# Patient Record
Sex: Female | Born: 1940 | Race: White | Hispanic: No | Marital: Married | Smoking: Never smoker
Health system: Southern US, Community
[De-identification: ages and names within clinical notes are randomized; demographics above are authoritative.]

## PROBLEM LIST (undated history)

## (undated) DIAGNOSIS — T783XXA Angioneurotic edema, initial encounter: Secondary | ICD-10-CM

## (undated) DIAGNOSIS — E7800 Pure hypercholesterolemia, unspecified: Secondary | ICD-10-CM

## (undated) DIAGNOSIS — L409 Psoriasis, unspecified: Secondary | ICD-10-CM

## (undated) DIAGNOSIS — K219 Gastro-esophageal reflux disease without esophagitis: Secondary | ICD-10-CM

## (undated) DIAGNOSIS — I10 Essential (primary) hypertension: Secondary | ICD-10-CM

## (undated) DIAGNOSIS — F0390 Unspecified dementia without behavioral disturbance: Secondary | ICD-10-CM

## (undated) DIAGNOSIS — G459 Transient cerebral ischemic attack, unspecified: Secondary | ICD-10-CM

## (undated) DIAGNOSIS — M1990 Unspecified osteoarthritis, unspecified site: Secondary | ICD-10-CM

## (undated) DIAGNOSIS — K5730 Diverticulosis of large intestine without perforation or abscess without bleeding: Secondary | ICD-10-CM

## (undated) DIAGNOSIS — R55 Syncope and collapse: Secondary | ICD-10-CM

## (undated) HISTORY — DX: Pure hypercholesterolemia, unspecified: E78.00

## (undated) HISTORY — DX: Transient cerebral ischemic attack, unspecified: G45.9

## (undated) HISTORY — DX: Essential (primary) hypertension: I10

## (undated) HISTORY — PX: TONSILLECTOMY: SUR1361

## (undated) HISTORY — DX: Gastro-esophageal reflux disease without esophagitis: K21.9

## (undated) HISTORY — DX: Syncope and collapse: R55

## (undated) HISTORY — DX: Diverticulosis of large intestine without perforation or abscess without bleeding: K57.30

## (undated) HISTORY — PX: EYE SURGERY: SHX253

---
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
## 2004-09-05 ENCOUNTER — Ambulatory Visit: Payer: Self-pay | Admitting: Internal Medicine

## 2005-08-14 ENCOUNTER — Ambulatory Visit: Payer: Self-pay | Admitting: Internal Medicine

## 2005-10-23 ENCOUNTER — Ambulatory Visit: Payer: Self-pay | Admitting: Urology

## 2006-02-19 ENCOUNTER — Ambulatory Visit: Payer: Self-pay | Admitting: Internal Medicine

## 2006-12-21 ENCOUNTER — Ambulatory Visit: Payer: Self-pay | Admitting: Internal Medicine

## 2007-12-22 ENCOUNTER — Ambulatory Visit: Payer: Self-pay | Admitting: Internal Medicine

## 2008-01-11 ENCOUNTER — Ambulatory Visit: Payer: Self-pay | Admitting: Internal Medicine

## 2008-07-11 ENCOUNTER — Ambulatory Visit: Payer: Self-pay | Admitting: Internal Medicine

## 2008-08-13 ENCOUNTER — Ambulatory Visit: Payer: Self-pay | Admitting: Internal Medicine

## 2008-12-25 ENCOUNTER — Ambulatory Visit: Payer: Self-pay | Admitting: Internal Medicine

## 2009-10-17 ENCOUNTER — Ambulatory Visit: Payer: Self-pay | Admitting: Ophthalmology

## 2009-10-29 ENCOUNTER — Ambulatory Visit: Payer: Self-pay | Admitting: Ophthalmology

## 2009-11-22 ENCOUNTER — Ambulatory Visit: Payer: Self-pay | Admitting: Ophthalmology

## 2009-12-03 ENCOUNTER — Ambulatory Visit: Payer: Self-pay | Admitting: Ophthalmology

## 2010-01-05 HISTORY — PX: OTHER SURGICAL HISTORY: SHX169

## 2010-01-17 ENCOUNTER — Ambulatory Visit: Payer: Self-pay | Admitting: Internal Medicine

## 2011-02-25 ENCOUNTER — Ambulatory Visit: Payer: Self-pay | Admitting: Internal Medicine

## 2011-03-09 ENCOUNTER — Ambulatory Visit: Payer: Self-pay | Admitting: Gastroenterology

## 2011-03-25 ENCOUNTER — Other Ambulatory Visit: Payer: Self-pay | Admitting: Gastroenterology

## 2011-03-25 LAB — CLOSTRIDIUM DIFFICILE BY PCR

## 2011-03-27 LAB — STOOL CULTURE

## 2011-11-05 ENCOUNTER — Ambulatory Visit (INDEPENDENT_AMBULATORY_CARE_PROVIDER_SITE_OTHER): Payer: Medicare Other | Admitting: Internal Medicine

## 2011-11-05 ENCOUNTER — Encounter: Payer: Self-pay | Admitting: Internal Medicine

## 2011-11-05 VITALS — BP 132/72 | HR 67 | Temp 98.5°F | Ht 65.0 in | Wt 155.0 lb

## 2011-11-05 DIAGNOSIS — E7800 Pure hypercholesterolemia, unspecified: Secondary | ICD-10-CM

## 2011-11-05 DIAGNOSIS — G459 Transient cerebral ischemic attack, unspecified: Secondary | ICD-10-CM

## 2011-11-05 DIAGNOSIS — R413 Other amnesia: Secondary | ICD-10-CM

## 2011-11-05 DIAGNOSIS — I10 Essential (primary) hypertension: Secondary | ICD-10-CM

## 2011-11-05 DIAGNOSIS — R109 Unspecified abdominal pain: Secondary | ICD-10-CM

## 2011-11-05 DIAGNOSIS — K219 Gastro-esophageal reflux disease without esophagitis: Secondary | ICD-10-CM

## 2011-11-05 NOTE — Patient Instructions (Addendum)
It was good seeing you today.  I am going to get an abdominal ultrasound and labs scheduled.  We will notify you of your results once they become available.

## 2011-11-06 ENCOUNTER — Encounter: Payer: Self-pay | Admitting: Internal Medicine

## 2011-11-06 DIAGNOSIS — I10 Essential (primary) hypertension: Secondary | ICD-10-CM | POA: Insufficient documentation

## 2011-11-06 DIAGNOSIS — G459 Transient cerebral ischemic attack, unspecified: Secondary | ICD-10-CM | POA: Insufficient documentation

## 2011-11-06 DIAGNOSIS — K219 Gastro-esophageal reflux disease without esophagitis: Secondary | ICD-10-CM | POA: Insufficient documentation

## 2011-11-06 NOTE — Assessment & Plan Note (Signed)
Blood pressure under good control.  Continue Lisinopril and HCTZ.  Follow.  Check metabolic panel with next labs.    

## 2011-11-06 NOTE — Assessment & Plan Note (Signed)
Continue on Lovastatin.  Check lipid panel and liver function with next fasting labs.      

## 2011-11-06 NOTE — Assessment & Plan Note (Signed)
Previous TIA.  On aspirin and doing well.  Follow.   

## 2011-11-06 NOTE — Progress Notes (Signed)
  Subjective:    Patient ID: Anita Santana, female    DOB: Aug 20, 1940, 71 y.o.   MRN: [id number]  HPI 71 year old female with past history of hypertension, hypercholesterolemia and GERD who comes in today for a scheduled follow up.  States she has been doing relatively well.  She has had some issues with her stomach.  Some change in appetite.  Does not eat as much as she used to.  She has noticed some queasiness at times.  Some aching in her stomach.  Food may make this worse.  She is still eating.  No vomiting.  Sees Dr Marva Panda.  Has reflux.  No chest pain or discomfort.  Had extensive cardiac w/up recently.  Protonix and Zantac seem to be controlling her symptoms.  No bowel change.  Taking Metamucil.    Past Medical History  Diagnosis Date  . GERD (gastroesophageal reflux disease)   . Hypertension   . Hypercholesteremia   . TIA (transient ischemic attack)     Review of Systems ROS Patient denies any headache, lightheadedness or dizziness.  No sinus or allergy symptoms.  No chest pain, tightness or palpatations.  No increased shortness of breath, cough or congestion.  GI symptoms as outlined.  No vomiting.  Some abdominal aching.  No bowel change, such as diarrhea, constipation, BRBPR or melana.  No urine change.        Objective:   Physical Exam Filed Vitals:   11/05/11 1529  BP: 132/72  Pulse: 67  Temp: 98.5 F (57.60 C)   71 year old female in no acute distress.   HEENT:  Nares - clear.  OP- without lesions or erythema.  NECK:  Supple, nontender.  No audible bruit.   HEART:  Appears to be regular. LUNGS:  Without crackles or wheezing audible.  Respirations even and unlabored.   RADIAL PULSE:  Equal bilaterally.  ABDOMEN:  Soft, nontender.  No audible abdominal bruit.   EXTREMITIES:  No increased edema to be present.                     Assessment & Plan:  GI.  Has the change in appetite, stomach aching and queasiness as outlined.  Has been persistent.  Will check cbc, liver  panel and amylase/lipase.  Also check abdominal ultrasound.  She has had EGD by Dr Marva Panda.  Reflux symptoms and the previous chest discomfort - controlled on Protonix and Zantac.    MEMORY CHANGE.  Husband reported (after the visit) concern regarding her short term memory.  With her scheduled labs, will also check B12/tsh.  Further w/up pending results.    HEALTH MAINTENANCE.  Will schedule a physical at the next visit.  Colonoscopy 2013.  Obtain records for review.  Flu shot given today.

## 2011-11-06 NOTE — Assessment & Plan Note (Signed)
Symptoms controlled on Protonix and Zantac.  Follow. Continue follow up with Dr Skulskie.    

## 2011-11-10 ENCOUNTER — Ambulatory Visit: Payer: Self-pay | Admitting: Internal Medicine

## 2011-11-12 ENCOUNTER — Telehealth: Payer: Self-pay | Admitting: Internal Medicine

## 2011-11-12 ENCOUNTER — Other Ambulatory Visit (INDEPENDENT_AMBULATORY_CARE_PROVIDER_SITE_OTHER): Payer: Medicare Other

## 2011-11-12 DIAGNOSIS — R413 Other amnesia: Secondary | ICD-10-CM

## 2011-11-12 DIAGNOSIS — R109 Unspecified abdominal pain: Secondary | ICD-10-CM

## 2011-11-12 DIAGNOSIS — E7800 Pure hypercholesterolemia, unspecified: Secondary | ICD-10-CM

## 2011-11-12 LAB — LIPID PANEL
Cholesterol: 277 mg/dL — ABNORMAL HIGH (ref 0–200)
HDL: 44.6 mg/dL (ref >39.00)
Triglycerides: 294 mg/dL — ABNORMAL HIGH (ref 0.0–149.0)
VLDL: 58.8 mg/dL — ABNORMAL HIGH (ref 0.0–40.0)

## 2011-11-12 LAB — CBC WITH DIFFERENTIAL/PLATELET
Basophils Absolute: 0.1 10*3/uL (ref 0.0–0.1)
HCT: 39 % (ref 36.0–46.0)
Lymphs Abs: 2.3 10*3/uL (ref 0.7–4.0)
MCV: 92.1 fl (ref 78.0–100.0)
Monocytes Absolute: 0.4 10*3/uL (ref 0.1–1.0)
Monocytes Relative: 6 % (ref 3.0–12.0)
Platelets: 262 10*3/uL (ref 150.0–400.0)
RDW: 13.5 % (ref 11.5–14.6)

## 2011-11-12 LAB — HEPATIC FUNCTION PANEL
Alkaline Phosphatase: 61 U/L (ref 39–117)
Bilirubin, Direct: 0 mg/dL (ref 0.0–0.3)
Total Bilirubin: 0.7 mg/dL (ref 0.3–1.2)

## 2011-11-12 LAB — BASIC METABOLIC PANEL
Calcium: 9.4 mg/dL (ref 8.4–10.5)
GFR: 60.89 mL/min (ref >60.00)
Sodium: 136 mEq/L (ref 135–145)

## 2011-11-12 LAB — LIPASE: Lipase: 26 U/L (ref 11.0–59.0)

## 2011-11-12 NOTE — Telephone Encounter (Addendum)
Called patient on cell and gave her the results.

## 2011-11-12 NOTE — Telephone Encounter (Signed)
Notify pt:  I received the abdominal ultrasound results.  Ultrasound reveals no acute abnormality.  Does appear to have some fatty liver.  Low cholesterol diet.

## 2011-11-20 ENCOUNTER — Encounter: Payer: Self-pay | Admitting: Internal Medicine

## 2011-11-20 DIAGNOSIS — K5730 Diverticulosis of large intestine without perforation or abscess without bleeding: Secondary | ICD-10-CM

## 2011-11-23 ENCOUNTER — Encounter: Payer: Self-pay | Admitting: *Deleted

## 2011-11-23 NOTE — Progress Notes (Signed)
Called patient to let her know. Patient could not talk to me at the moment and stated that she would call me back.

## 2011-11-23 NOTE — Progress Notes (Signed)
Result letter mailed to patient's home address.

## 2011-12-02 ENCOUNTER — Other Ambulatory Visit: Payer: Self-pay | Admitting: General Practice

## 2011-12-02 MED ORDER — LOVASTATIN 10 MG PO TABS: ORAL_TABLET | ORAL | Status: DC

## 2011-12-02 NOTE — Telephone Encounter (Signed)
Pt called stating that she needs her Lovastatin 5 mg refilled. States she takes meds 5 days a week per Dr. Lorin Picket. Med needs to be sent to [facility].

## 2011-12-02 NOTE — Telephone Encounter (Signed)
Sent in to pharmacy.  

## 2011-12-18 ENCOUNTER — Telehealth: Payer: Self-pay | Admitting: Internal Medicine

## 2011-12-18 MED ORDER — LISINOPRIL 40 MG PO TABS: 40.0000 mg | ORAL_TABLET | Freq: Every day | ORAL | Status: DC

## 2011-12-18 NOTE — Telephone Encounter (Addendum)
Refill - Pharmacy - [facility] Fax #[phone] Drug Name- Lisinopril Tab 40 mg Strength - Directions- take one by mouth daily  Quantity-90 day supply   Drug Name-Lovastatin 10 mg tab Directions-take 1 by mouth on Monday , Wednesday and Friday as directed Quantity-90 day supply

## 2011-12-18 NOTE — Telephone Encounter (Signed)
Sent in to pharmacy.  

## 2011-12-28 ENCOUNTER — Telehealth: Payer: Self-pay | Admitting: Internal Medicine

## 2011-12-28 NOTE — Telephone Encounter (Signed)
Pt is needing refill on Protonics, Hydrpcholorthorzide ??? HCTZ and  Lovastatin. [facility]

## 2011-12-29 MED ORDER — PANTOPRAZOLE SODIUM 40 MG PO TBEC: 40.0000 mg | DELAYED_RELEASE_TABLET | Freq: Every day | ORAL | Status: DC

## 2011-12-29 MED ORDER — LOVASTATIN 10 MG PO TABS: ORAL_TABLET | ORAL | Status: DC

## 2011-12-29 MED ORDER — HYDROCHLOROTHIAZIDE 25 MG PO TABS: 25.0000 mg | ORAL_TABLET | Freq: Every day | ORAL | Status: DC

## 2011-12-29 NOTE — Telephone Encounter (Signed)
Sent in to pharmacy.  

## 2012-01-07 ENCOUNTER — Telehealth: Payer: Self-pay | Admitting: Internal Medicine

## 2012-01-07 MED ORDER — HYDROCHLOROTHIAZIDE 25 MG PO TABS: 25.0000 mg | ORAL_TABLET | Freq: Every day | ORAL | Status: DC

## 2012-01-07 MED ORDER — LISINOPRIL 40 MG PO TABS: 40.0000 mg | ORAL_TABLET | Freq: Every day | ORAL | Status: DC

## 2012-01-07 MED ORDER — PANTOPRAZOLE SODIUM 40 MG PO TBEC: 40.0000 mg | DELAYED_RELEASE_TABLET | Freq: Every day | ORAL | Status: DC

## 2012-01-07 MED ORDER — LOVASTATIN 10 MG PO TABS: ORAL_TABLET | ORAL | Status: DC

## 2012-01-07 NOTE — Telephone Encounter (Signed)
Patient called in requesting her medication be faxed into [facility], it looks like you attempted to send 3 of the 4 in on 12/29/11. She needs her protonix, lovastatin, hydrodiual and lisinopril faxed into [phone]. It looks like these were sent in but the message on the med list is like the ones that a pharmacy wasn't selected on. If you have questions please ask.

## 2012-01-07 NOTE — Telephone Encounter (Signed)
Sent in to pharmacy.  

## 2012-01-17 ENCOUNTER — Telehealth: Payer: Self-pay | Admitting: Internal Medicine

## 2012-01-17 NOTE — Telephone Encounter (Signed)
Opened in error

## 2012-01-20 ENCOUNTER — Telehealth: Payer: Self-pay | Admitting: Internal Medicine

## 2012-01-20 MED ORDER — LOVASTATIN 10 MG PO TABS: ORAL_TABLET | ORAL | Status: DC

## 2012-01-20 MED ORDER — PANTOPRAZOLE SODIUM 40 MG PO TBEC: 40.0000 mg | DELAYED_RELEASE_TABLET | Freq: Every day | ORAL | Status: DC

## 2012-01-20 MED ORDER — LISINOPRIL 40 MG PO TABS: 40.0000 mg | ORAL_TABLET | Freq: Every day | ORAL | Status: DC

## 2012-01-20 MED ORDER — HYDROCHLOROTHIAZIDE 25 MG PO TABS: 25.0000 mg | ORAL_TABLET | Freq: Every day | ORAL | Status: DC

## 2012-01-20 NOTE — Telephone Encounter (Signed)
Corrected pharmacy error. Patient notified. Patient stated that she was going to wait for rx to arrive in mail.

## 2012-01-20 NOTE — Telephone Encounter (Signed)
Patient is completely out of her medication for her cholesterol and is needing all of her medications that were listed on 1.2.14 that were sent to [facility] . Her medication according to our records were sent to [facility] for [facility] instead of the correct pharmacy [facility] that is in [place].

## 2012-02-08 ENCOUNTER — Ambulatory Visit: Payer: Self-pay | Admitting: Gastroenterology

## 2012-02-08 DIAGNOSIS — K5730 Diverticulosis of large intestine without perforation or abscess without bleeding: Secondary | ICD-10-CM

## 2012-02-08 HISTORY — DX: Diverticulosis of large intestine without perforation or abscess without bleeding: K57.30

## 2012-02-09 ENCOUNTER — Encounter: Payer: Self-pay | Admitting: Adult Health

## 2012-02-09 ENCOUNTER — Ambulatory Visit (INDEPENDENT_AMBULATORY_CARE_PROVIDER_SITE_OTHER): Payer: Medicare Other | Admitting: Adult Health

## 2012-02-09 ENCOUNTER — Other Ambulatory Visit: Payer: Self-pay | Admitting: *Deleted

## 2012-02-09 VITALS — BP 138/82 | HR 84 | Temp 98.4°F | Resp 16 | Wt 154.0 lb

## 2012-02-09 DIAGNOSIS — I499 Cardiac arrhythmia, unspecified: Secondary | ICD-10-CM | POA: Insufficient documentation

## 2012-02-09 NOTE — Progress Notes (Signed)
  Subjective:    Patient ID: Anita Santana, female    DOB: August 21, 1940, 72 y.o.   MRN: [id number]  HPI  Anita Santana is a pleasant 72 y/o female with a hx of HTN, HLD, GERD, TIA who presents to clinic today s/p colonoscopy on 02/08/12 with report of arrhythmia/bigeminal block without change in her B/P. Patient does not recall event and further reports "I was sedated". When patient awoke from conscious sedation, she denied having any palpitations, chest pain, sob, dizziness, lightheadedness. She was completely asymptomatic. She was asked to f/u with her PCP by Dr. Marva Panda.  Today, patient present with her husband. She is feeling well. Denies any c/o. Once again, she is asymptomatic.   Current Outpatient Prescriptions on File Prior to Visit  Medication Sig Dispense Refill  . acetaminophen (TYLENOL ARTHRITIS PAIN) 650 MG CR tablet Take 650 mg by mouth as needed.      Marland Kitchen aspirin 81 MG tablet Take 81 mg by mouth daily.      . hydrochlorothiazide (HYDRODIURIL) 25 MG tablet Take 1 tablet (25 mg total) by mouth daily.  90 tablet  1  . lisinopril (PRINIVIL,ZESTRIL) 40 MG tablet Take 1 tablet (40 mg total) by mouth daily.  90 tablet  1  . lovastatin (MEVACOR) 10 MG tablet Take one tablet five times a week  60 tablet  1  . pantoprazole (PROTONIX) 40 MG tablet Take 1 tablet (40 mg total) by mouth daily.  90 tablet  1  . Psyllium (METAMUCIL PO) Take by mouth daily.      . ranitidine (ZANTAC) 150 MG tablet Take 150 mg by mouth as needed.      . simethicone (GAS-X) 80 MG chewable tablet Chew 80 mg by mouth as needed.          Review of Systems  Constitutional: Negative.   Respiratory: Negative for cough, chest tightness, shortness of breath and wheezing.   Cardiovascular: Negative for chest pain, palpitations and leg swelling.  Gastrointestinal: Negative.   Skin: Negative for color change, pallor and rash.  Neurological: Negative for dizziness, syncope, speech difficulty, light-headedness, numbness and  headaches.  Psychiatric/Behavioral: Negative.     BP 138/82  Pulse 84  Temp 98.4 F (36.9 C) (Oral)  Resp 16  Wt 154 lb (69.854 kg)  SpO2 96%     Objective:   Physical Exam  Constitutional: She is oriented to person, place, and time. She appears well-developed and well-nourished. No distress.  HENT:  Head: Normocephalic and atraumatic.  Eyes: Conjunctivae normal are normal. Pupils are equal, round, and reactive to light.  Neck: Normal range of motion. No tracheal deviation present.  Cardiovascular: Normal rate, regular rhythm, S1 normal, S2 normal, normal heart sounds, intact distal pulses and normal pulses.  PMI is not displaced.  Exam reveals no gallop, no S3, no S4 and no friction rub.   No murmur heard. Pulmonary/Chest: Effort normal and breath sounds normal.  Abdominal: Soft. Bowel sounds are normal.  Musculoskeletal: Normal range of motion. She exhibits no edema.  Lymphadenopathy:    She has no cervical adenopathy.  Neurological: She is alert and oriented to person, place, and time. She exhibits normal muscle tone. Coordination normal.  Skin: Skin is warm and dry.  Psychiatric: She has a normal mood and affect. Her behavior is normal. Judgment and thought content normal.        Assessment & Plan:

## 2012-02-09 NOTE — Assessment & Plan Note (Signed)
Patient is completely asymptomatic today. EKG done and compared to previous EKG done in 2011 without significant changes. Suspect possibility of arrythmia was a result of conscious sedation for procedure. Will refer to Cards for further evaluation.

## 2012-02-09 NOTE — Patient Instructions (Addendum)
  Your EKG today did not show significant changes in comparison to the one done in 2011.  I am referring you to [facility] for further evaluation given the report of your recent dysrhythmia during your colonoscopy.  Our office will call you with an appointment with either Dr. Mariah Milling or Dr. Kirke Corin.

## 2012-02-11 ENCOUNTER — Ambulatory Visit: Payer: Medicare Other | Admitting: Internal Medicine

## 2012-02-15 DIAGNOSIS — K5730 Diverticulosis of large intestine without perforation or abscess without bleeding: Secondary | ICD-10-CM | POA: Insufficient documentation

## 2012-02-18 ENCOUNTER — Ambulatory Visit: Payer: Medicare Other | Admitting: Cardiovascular Disease

## 2012-02-18 ENCOUNTER — Encounter: Payer: Self-pay | Admitting: Internal Medicine

## 2012-02-19 ENCOUNTER — Ambulatory Visit: Payer: Medicare Other | Admitting: Internal Medicine

## 2012-02-19 ENCOUNTER — Encounter: Payer: Medicare Other | Admitting: Internal Medicine

## 2012-02-26 ENCOUNTER — Ambulatory Visit (INDEPENDENT_AMBULATORY_CARE_PROVIDER_SITE_OTHER): Payer: Medicare Other | Admitting: Cardiovascular Disease

## 2012-02-26 ENCOUNTER — Encounter: Payer: Self-pay | Admitting: Cardiovascular Disease

## 2012-02-26 VITALS — BP 120/70 | HR 61 | Ht 65.5 in | Wt 154.5 lb

## 2012-02-26 DIAGNOSIS — I4891 Unspecified atrial fibrillation: Secondary | ICD-10-CM

## 2012-02-26 DIAGNOSIS — E7800 Pure hypercholesterolemia, unspecified: Secondary | ICD-10-CM

## 2012-02-26 DIAGNOSIS — I499 Cardiac arrhythmia, unspecified: Secondary | ICD-10-CM

## 2012-02-26 DIAGNOSIS — I10 Essential (primary) hypertension: Secondary | ICD-10-CM

## 2012-02-26 NOTE — Assessment & Plan Note (Signed)
We have suggested she try red yeast rice alternating with the lovastatin. Other options also include WelChol or colestipol, even Zetia. She does have a family history of stroke

## 2012-02-26 NOTE — Assessment & Plan Note (Signed)
Blood pressure is well controlled. She does report having periodic orthostatic type symptoms if she stands too quickly. I've asked her to monitor her blood pressure and if this is running low, she could perhaps cut her HCTZ in half. Husband reports she does not drink much fluids.

## 2012-02-26 NOTE — Progress Notes (Signed)
Patient ID: Anita Santana, female    DOB: [birth year], 72 y.o.   MRN: [id number]  HPI Comments: Anita Santana is a very pleasant 72 year old woman with history of GERD, hyperlipidemia who presents by referral from Dr. Lorin Picket and Racquel Ray for recent arrhythmia during a colonoscopy.  She reports that she had a magnesium citrate prep, drank several bottles with good effect prior to the procedure. Anesthesia was giving her sedation medicines when the telemetry showed normal sinus rhythm with 2-1 AV block for a short period of time. By report, arrhythmia shortly resolved and back to normal. She does not remember having any symptoms at the time.  Prior to the procedure and following the procedure, she denies any palpitations, lightheadedness or dizziness. She has had an EKG in the primary care office an EKG today which are essentially normal. She has not had any cardiac issues in the past.  She does report having occasional dizziness when she stands up too quickly  She does report having hyperlipidemia. She is taking lovastatin 10 mg 3 times per week. Taking a statin daily caused muscle ache.   She has had an echocardiogram several years ago at [facility]. Stress test several years ago at [facility]. She reports that these were both normal  EKG shows normal sinus rhythm with rate 61 beats per minute with no significant ST or T wave changes   Outpatient Encounter Prescriptions as of 02/26/2012  Medication Sig Dispense Refill  . acetaminophen (TYLENOL ARTHRITIS PAIN) 650 MG CR tablet Take 650 mg by mouth as needed.      Marland Kitchen aspirin 81 MG tablet Take 81 mg by mouth daily.      . hydrochlorothiazide (HYDRODIURIL) 25 MG tablet Take 1 tablet (25 mg total) by mouth daily.  90 tablet  1  . lisinopril (PRINIVIL,ZESTRIL) 40 MG tablet Take 1 tablet (40 mg total) by mouth daily.  90 tablet  1  . lovastatin (MEVACOR) 10 MG tablet Take one tablet five times a week  60 tablet  1  . pantoprazole (PROTONIX) 40 MG tablet Take 1  tablet (40 mg total) by mouth daily.  90 tablet  1  . Psyllium (METAMUCIL PO) Take by mouth daily.      . ranitidine (ZANTAC) 150 MG tablet Take 150 mg by mouth as needed.      . simethicone (GAS-X) 80 MG chewable tablet Chew 80 mg by mouth as needed.        Review of Systems  Constitutional: Negative.   HENT: Negative.   Eyes: Negative.   Respiratory: Negative.   Cardiovascular: Negative.   Gastrointestinal: Negative.   Musculoskeletal: Negative.   Skin: Negative.   Neurological: Negative.   Psychiatric/Behavioral: Negative.   All other systems reviewed and are negative.    BP 120/70  Pulse 61  Ht 5' 5.5" (1.664 m)  Wt 154 lb 8 oz (70.081 kg)  BMI 25.31 kg/m2  Physical Exam  Nursing note and vitals reviewed. Constitutional: She is oriented to person, place, and time. She appears well-developed and well-nourished.  HENT:  Head: Normocephalic.  Nose: Nose normal.  Mouth/Throat: Oropharynx is clear and moist.  Eyes: Conjunctivae are normal. Pupils are equal, round, and reactive to light.  Neck: Normal range of motion. Neck supple. No JVD present.  Cardiovascular: Normal rate, regular rhythm, S1 normal, S2 normal, normal heart sounds and intact distal pulses.  Exam reveals no gallop and no friction rub.   No murmur heard. Pulmonary/Chest: Effort normal and breath sounds normal.  No respiratory distress. She has no wheezes. She has no rales. She exhibits no tenderness.  Abdominal: Soft. Bowel sounds are normal. She exhibits no distension. There is no tenderness.  Musculoskeletal: Normal range of motion. She exhibits no edema and no tenderness.  Lymphadenopathy:    She has no cervical adenopathy.  Neurological: She is alert and oriented to person, place, and time. Coordination normal.  Skin: Skin is warm and dry. No rash noted. No erythema.  Psychiatric: She has a normal mood and affect. Her behavior is normal. Judgment and thought content normal.    Assessment and Plan

## 2012-02-26 NOTE — Patient Instructions (Addendum)
You are doing well. No medication changes were made.  For cholesterol, you could try red yeast rice on every other day with lovastatin Other options include welchol/colestipol Another one is zetia one a day  Please call us if you have new issues that need to be addressed before your next appt.

## 2012-02-26 NOTE — Assessment & Plan Note (Signed)
Normal sinus rhythm with 2:1 AV block noted for a short period while anesthesia medicines were given.  No symptoms prior to this,no symptoms since that time. 2 EKGs are essentially normal.  Arrhythmia likely secondary to anesthesia. We will hold off on any testing at this time as she has had echocardiogram and stress test in the recent past. We have suggested she has any additional lightheadedness or palpitation symptoms that she call our office for a Holter monitor.

## 2012-02-29 ENCOUNTER — Encounter: Payer: Self-pay | Admitting: Internal Medicine

## 2012-03-14 ENCOUNTER — Ambulatory Visit (INDEPENDENT_AMBULATORY_CARE_PROVIDER_SITE_OTHER): Payer: Medicare Other | Admitting: Internal Medicine

## 2012-03-14 ENCOUNTER — Encounter: Payer: Self-pay | Admitting: Internal Medicine

## 2012-03-14 ENCOUNTER — Other Ambulatory Visit (HOSPITAL_COMMUNITY)
Admission: RE | Admit: 2012-03-14 | Discharge: 2012-03-14 | Disposition: A | Discharge status: Home / Self Care | Payer: Medicare Other | Source: Ambulatory Visit | Attending: Internal Medicine | Admitting: Internal Medicine

## 2012-03-14 VITALS — BP 112/60 | HR 63 | Temp 98.6°F | Ht 65.0 in | Wt 152.5 lb

## 2012-03-14 DIAGNOSIS — E7800 Pure hypercholesterolemia, unspecified: Secondary | ICD-10-CM

## 2012-03-14 DIAGNOSIS — I499 Cardiac arrhythmia, unspecified: Secondary | ICD-10-CM

## 2012-03-14 DIAGNOSIS — Z1151 Encounter for screening for human papillomavirus (HPV): Secondary | ICD-10-CM | POA: Insufficient documentation

## 2012-03-14 DIAGNOSIS — Z01419 Encounter for gynecological examination (general) (routine) without abnormal findings: Secondary | ICD-10-CM | POA: Insufficient documentation

## 2012-03-14 DIAGNOSIS — Z124 Encounter for screening for malignant neoplasm of cervix: Secondary | ICD-10-CM

## 2012-03-14 DIAGNOSIS — G459 Transient cerebral ischemic attack, unspecified: Secondary | ICD-10-CM

## 2012-03-14 DIAGNOSIS — Z1239 Encounter for other screening for malignant neoplasm of breast: Secondary | ICD-10-CM

## 2012-03-14 DIAGNOSIS — K219 Gastro-esophageal reflux disease without esophagitis: Secondary | ICD-10-CM

## 2012-03-14 DIAGNOSIS — I10 Essential (primary) hypertension: Secondary | ICD-10-CM

## 2012-03-14 NOTE — Progress Notes (Signed)
Subjective:    Patient ID: Anita Santana, female    DOB: [birth year], 72 y.o.   MRN: [id number]  HPI 72 year old female with past history of hypertension, hypercholesterolemia, GERD and previously presumed TIA.  She comes in today to follow up on these issues as well as for a complete physical exam.  She states she is doing relatively well.  Some increased stress with her husbands stress.  She feels she is handling things relatively well.  Still some occasional nausea.  No vomiting.  Saw GI.  Had EGD and colonoscopy.  Bowels stable.  Saw Dr Mariah Milling.  Heart checked out fine.  No increased heart rate or palpitations.     Past Medical History  Diagnosis Date  . GERD (gastroesophageal reflux disease)   . Hypertension   . Hypercholesteremia   . TIA (transient ischemic attack)   . Syncope and collapse     Current Outpatient Prescriptions on File Prior to Visit  Medication Sig Dispense Refill  . acetaminophen (TYLENOL ARTHRITIS PAIN) 650 MG CR tablet Take 650 mg by mouth as needed.      Marland Kitchen aspirin 81 MG tablet Take 81 mg by mouth daily.      . hydrochlorothiazide (HYDRODIURIL) 25 MG tablet Take 1 tablet (25 mg total) by mouth daily.  90 tablet  1  . lisinopril (PRINIVIL,ZESTRIL) 40 MG tablet Take 1 tablet (40 mg total) by mouth daily.  90 tablet  1  . lovastatin (MEVACOR) 10 MG tablet Take one tablet five times a week  60 tablet  1  . pantoprazole (PROTONIX) 40 MG tablet Take 1 tablet (40 mg total) by mouth daily.  90 tablet  1  . Psyllium (METAMUCIL PO) Take by mouth daily.      . ranitidine (ZANTAC) 150 MG tablet Take 150 mg by mouth as needed.      . simethicone (GAS-X) 80 MG chewable tablet Chew 80 mg by mouth as needed.       No current facility-administered medications on file prior to visit.    Review of Systems Patient denies any headache, lightheadedness or dizziness.  No sinus or allergy symptoms.  No chest pain, tightness or palpitations.  No increased shortness of breath, cough or  congestion.  No vomiting.  Some occasional nausea.  See above.  No acid reflux.  No abdominal pain or cramping.  No bowel change, such as diarrhea, constipation, BRBPR or melana.  No urine change.  Feels she is handling stress relatively well.      Objective:   Physical Exam Filed Vitals:   03/14/12 0849  BP: 112/60  Pulse: 63  Temp: 98.6 F (37 C)   Blood pressure recheck:  118/78, pulse 87  72 year old female in no acute distress.   HEENT:  Nares- clear.  Oropharynx - without lesions. NECK:  Supple.  Nontender.  No audible bruit.  HEART:  Appears to be regular. LUNGS:  No crackles or wheezing audible.  Respirations even and unlabored.  RADIAL PULSE:  Equal bilaterally.    BREASTS:  No nipple discharge or nipple retraction present.  Could not appreciate any distinct nodules or axillary adenopathy.  ABDOMEN:  Soft, nontender.  Bowel sounds present and normal.  No audible abdominal bruit.  GU:  Normal external genitalia.  Vaginal vault without lesions.  Cervix identified.  Pap performed. Could not appreciate any adnexal masses or tenderness.   RECTAL:  Heme negative.   EXTREMITIES:  No increased edema present.  DP pulses  palpable and equal bilaterally.          Assessment & Plan:  GI.  Some occasional nausea.  Just evaluated by GI.  Had abdominal ultrasound.  Fatty liver.  No other acute abnormality.  Discussed further w/up.  She elects to follow.  Avoid foods that aggravate.   INCREASED PSYCHOSOCIAL STRESSORS.  Feels she is handling stress well.  Follow.   HEALTH MAINTENANCE.  Physical today.  Colonoscopy 2013.  Pap today.  Schedule mammogram.

## 2012-03-15 ENCOUNTER — Encounter: Payer: Self-pay | Admitting: Internal Medicine

## 2012-03-15 LAB — LIPID PANEL
Cholesterol: 281 mg/dL — ABNORMAL HIGH (ref 0–200)
HDL: 48.6 mg/dL (ref >39.00)
Triglycerides: 245 mg/dL — ABNORMAL HIGH (ref 0.0–149.0)

## 2012-03-15 LAB — BASIC METABOLIC PANEL
Calcium: 9.5 mg/dL (ref 8.4–10.5)
GFR: 71.95 mL/min (ref >60.00)
Sodium: 137 mEq/L (ref 135–145)

## 2012-03-15 LAB — LDL CHOLESTEROL, DIRECT: Direct LDL: 191.2 mg/dL

## 2012-03-15 LAB — TSH: TSH: 1.54 u[IU]/mL (ref 0.35–5.50)

## 2012-03-15 LAB — HEPATIC FUNCTION PANEL
ALT: 16 U/L (ref 0–35)
Total Protein: 7.1 g/dL (ref 6.0–8.3)

## 2012-03-15 NOTE — Assessment & Plan Note (Signed)
Previous TIA.  On aspirin and doing well.  Follow.   

## 2012-03-15 NOTE — Assessment & Plan Note (Signed)
Continue on Lovastatin.  Check lipid panel and liver function with next fasting labs.

## 2012-03-15 NOTE — Assessment & Plan Note (Signed)
Evaluated by Dr Mariah Milling.  Heart checked out fine.  Currently asymptomatic.  Follow.

## 2012-03-15 NOTE — Assessment & Plan Note (Signed)
Blood pressure under good control.  Continue Lisinopril and HCTZ.  Follow.  Check metabolic panel with next labs.

## 2012-03-15 NOTE — Assessment & Plan Note (Signed)
Symptoms controlled on Protonix and Zantac.  Follow. Continue follow up with Dr Marva Panda.

## 2012-03-17 ENCOUNTER — Other Ambulatory Visit: Payer: Self-pay | Admitting: Internal Medicine

## 2012-03-17 DIAGNOSIS — I10 Essential (primary) hypertension: Secondary | ICD-10-CM

## 2012-03-17 DIAGNOSIS — E7800 Pure hypercholesterolemia, unspecified: Secondary | ICD-10-CM

## 2012-03-17 NOTE — Progress Notes (Signed)
Follow up labs ordered.

## 2012-04-08 ENCOUNTER — Telehealth: Payer: Self-pay | Admitting: Internal Medicine

## 2012-04-08 NOTE — Telephone Encounter (Signed)
Patient wants her lab results mailed to her home.

## 2012-04-08 NOTE — Telephone Encounter (Signed)
Labs mailed to patients home address.

## 2012-04-12 ENCOUNTER — Ambulatory Visit: Payer: Self-pay | Admitting: Internal Medicine

## 2012-04-13 ENCOUNTER — Ambulatory Visit: Payer: Self-pay | Admitting: Internal Medicine

## 2012-04-13 ENCOUNTER — Telehealth: Payer: Self-pay | Admitting: Internal Medicine

## 2012-04-13 DIAGNOSIS — R928 Other abnormal and inconclusive findings on diagnostic imaging of breast: Secondary | ICD-10-CM

## 2012-04-13 NOTE — Telephone Encounter (Signed)
Pt notified of mammo results and need for referral to surgery.  She request to see Dr Renda Rolls.  Order for referral placed.

## 2012-04-15 ENCOUNTER — Other Ambulatory Visit (INDEPENDENT_AMBULATORY_CARE_PROVIDER_SITE_OTHER): Payer: Medicare Other

## 2012-04-15 DIAGNOSIS — E7800 Pure hypercholesterolemia, unspecified: Secondary | ICD-10-CM

## 2012-04-15 DIAGNOSIS — I10 Essential (primary) hypertension: Secondary | ICD-10-CM

## 2012-04-15 LAB — HEPATIC FUNCTION PANEL
Albumin: 4.2 g/dL (ref 3.5–5.2)
Bilirubin, Direct: 0.1 mg/dL (ref 0.0–0.3)
Total Protein: 7.2 g/dL (ref 6.0–8.3)

## 2012-04-15 LAB — BASIC METABOLIC PANEL
Calcium: 9.2 mg/dL (ref 8.4–10.5)
Creatinine, Ser: 0.8 mg/dL (ref 0.4–1.2)

## 2012-04-15 LAB — LIPID PANEL
Cholesterol: 259 mg/dL — ABNORMAL HIGH (ref 0–200)
HDL: 43.9 mg/dL (ref >39.00)
VLDL: 39.8 mg/dL (ref 0.0–40.0)

## 2012-04-15 LAB — LDL CHOLESTEROL, DIRECT: Direct LDL: 176.8 mg/dL

## 2012-04-16 ENCOUNTER — Telehealth: Payer: Self-pay | Admitting: Internal Medicine

## 2012-04-16 DIAGNOSIS — E871 Hypo-osmolality and hyponatremia: Secondary | ICD-10-CM

## 2012-04-16 NOTE — Telephone Encounter (Signed)
Pt notified of labs and need for follow up sodium.  She is coming in 04/28/12 (9:00).  Pt aware of appt.  Please put on lab schedule.  Thanks.

## 2012-04-19 NOTE — Telephone Encounter (Signed)
Appointment made

## 2012-04-27 ENCOUNTER — Encounter: Payer: Self-pay | Admitting: Internal Medicine

## 2012-04-28 ENCOUNTER — Encounter: Payer: Self-pay | Admitting: *Deleted

## 2012-04-28 ENCOUNTER — Other Ambulatory Visit (INDEPENDENT_AMBULATORY_CARE_PROVIDER_SITE_OTHER): Payer: Medicare Other

## 2012-04-28 DIAGNOSIS — E871 Hypo-osmolality and hyponatremia: Secondary | ICD-10-CM

## 2012-04-28 LAB — SODIUM: Sodium: 137 mEq/L (ref 135–145)

## 2012-07-15 ENCOUNTER — Encounter: Payer: Self-pay | Admitting: Internal Medicine

## 2012-07-15 ENCOUNTER — Ambulatory Visit (INDEPENDENT_AMBULATORY_CARE_PROVIDER_SITE_OTHER): Payer: Medicare Other | Admitting: Internal Medicine

## 2012-07-15 VITALS — BP 120/70 | HR 70 | Temp 98.4°F | Ht 65.0 in | Wt 147.8 lb

## 2012-07-15 DIAGNOSIS — G459 Transient cerebral ischemic attack, unspecified: Secondary | ICD-10-CM

## 2012-07-15 DIAGNOSIS — I10 Essential (primary) hypertension: Secondary | ICD-10-CM

## 2012-07-15 DIAGNOSIS — R413 Other amnesia: Secondary | ICD-10-CM

## 2012-07-15 DIAGNOSIS — K219 Gastro-esophageal reflux disease without esophagitis: Secondary | ICD-10-CM

## 2012-07-15 DIAGNOSIS — R42 Dizziness and giddiness: Secondary | ICD-10-CM

## 2012-07-15 DIAGNOSIS — K5730 Diverticulosis of large intestine without perforation or abscess without bleeding: Secondary | ICD-10-CM

## 2012-07-15 DIAGNOSIS — R29898 Other symptoms and signs involving the musculoskeletal system: Secondary | ICD-10-CM

## 2012-07-15 DIAGNOSIS — E7800 Pure hypercholesterolemia, unspecified: Secondary | ICD-10-CM

## 2012-07-15 DIAGNOSIS — R928 Other abnormal and inconclusive findings on diagnostic imaging of breast: Secondary | ICD-10-CM

## 2012-07-15 LAB — CBC WITH DIFFERENTIAL/PLATELET
Eosinophils Relative: 4.1 % (ref 0.0–5.0)
Lymphocytes Relative: 36 % (ref 12.0–46.0)
Monocytes Relative: 6.5 % (ref 3.0–12.0)
Neutrophils Relative %: 52.4 % (ref 43.0–77.0)
Platelets: 267 10*3/uL (ref 150.0–400.0)
WBC: 7.3 10*3/uL (ref 4.5–10.5)

## 2012-07-15 LAB — HEPATIC FUNCTION PANEL
Bilirubin, Direct: 0.1 mg/dL (ref 0.0–0.3)
Total Bilirubin: 0.9 mg/dL (ref 0.3–1.2)
Total Protein: 7.5 g/dL (ref 6.0–8.3)

## 2012-07-15 LAB — LIPID PANEL
HDL: 49.4 mg/dL (ref >39.00)
Triglycerides: 298 mg/dL — ABNORMAL HIGH (ref 0.0–149.0)
VLDL: 59.6 mg/dL — ABNORMAL HIGH (ref 0.0–40.0)

## 2012-07-15 LAB — TSH: TSH: 1.35 u[IU]/mL (ref 0.35–5.50)

## 2012-07-15 LAB — BASIC METABOLIC PANEL
CO2: 27 mEq/L (ref 19–32)
Glucose, Bld: 97 mg/dL (ref 70–99)
Potassium: 4.5 mEq/L (ref 3.5–5.1)
Sodium: 136 mEq/L (ref 135–145)

## 2012-07-15 LAB — VITAMIN B12: Vitamin B-12: 509 pg/mL (ref 211–911)

## 2012-07-17 ENCOUNTER — Encounter: Payer: Self-pay | Admitting: Internal Medicine

## 2012-07-17 DIAGNOSIS — R928 Other abnormal and inconclusive findings on diagnostic imaging of breast: Secondary | ICD-10-CM | POA: Insufficient documentation

## 2012-07-17 DIAGNOSIS — F0150 Vascular dementia without behavioral disturbance: Secondary | ICD-10-CM | POA: Insufficient documentation

## 2012-07-17 NOTE — Progress Notes (Signed)
Subjective:    Patient ID: Anita Santana, female    DOB: [birth year], 72 y.o.   MRN: [id number]  HPI 72 year old female with past history of hypertension, hypercholesterolemia, GERD and previously presumed TIA.  She comes in today for a scheduled follow up.  She states she is doing relatively well. She feels she is handling things relatively well.  Still some occasional nausea.  Decreased appetite.  States she does not get hungry.  No vomiting.  Has lost weight.  Saw GI.  Had EGD and colonoscopy.  Bowels stable.  Talked with her regarding further w/up including CT scan, etc.  She declines at this time.  Wants to monitor for now.  Saw Dr Mariah Milling.  Heart checked out fine.  No increased heart rate or palpitations.  She also reports getting light headed.  Notices when she sits for while and stands - will be light headed.  She also notices the light headedness when she bends over and raises up.  Notices her left leg wants to give way on her at times.  States she feels like her left leg "drifts" when she is walking.  No numbness or tingling.      Past Medical History  Diagnosis Date  . GERD (gastroesophageal reflux disease)   . Hypertension   . Hypercholesteremia   . TIA (transient ischemic attack)   . Syncope and collapse     Current Outpatient Prescriptions on File Prior to Visit  Medication Sig Dispense Refill  . acetaminophen (TYLENOL ARTHRITIS PAIN) 650 MG CR tablet Take 650 mg by mouth as needed.      Marland Kitchen aspirin 81 MG tablet Take 81 mg by mouth daily.      . hydrochlorothiazide (HYDRODIURIL) 25 MG tablet Take 1 tablet (25 mg total) by mouth daily.  90 tablet  1  . lisinopril (PRINIVIL,ZESTRIL) 40 MG tablet Take 1 tablet (40 mg total) by mouth daily.  90 tablet  1  . pantoprazole (PROTONIX) 40 MG tablet Take 1 tablet (40 mg total) by mouth daily.  90 tablet  1  . Psyllium (METAMUCIL PO) Take by mouth daily.      . ranitidine (ZANTAC) 150 MG tablet Take 150 mg by mouth as needed.      .  simethicone (GAS-X) 80 MG chewable tablet Chew 80 mg by mouth as needed.       No current facility-administered medications on file prior to visit.    Review of Systems Patient denies any headache, lightheadedness or dizziness.  No sinus or allergy symptoms.  No chest pain, tightness or palpitations.  No increased shortness of breath, cough or congestion.  No vomiting.  Some occasional nausea.  See above.  Decreased appetite.  No acid reflux.  No abdominal pain or cramping.  No bowel change, such as diarrhea, constipation, BRBPR or melana.  No urine change.  Feels she is handling stress relatively well.  Light headedness as outlined.       Objective:   Physical Exam  Filed Vitals:   07/15/12 0930  BP: 120/70  Pulse: 70  Temp: 98.4 F (36.9 C)   Pulse recheck 42  72 year old female in no acute distress.   HEENT:  Nares- clear.  Oropharynx - without lesions. NECK:  Supple.  Nontender.  No audible bruit.  HEART:  Appears to be regular. LUNGS:  No crackles or wheezing audible.  Respirations even and unlabored.  RADIAL PULSE:  Equal bilaterally.  ABDOMEN:  Soft, nontender.  Bowel  sounds present and normal.  No audible abdominal bruit.    EXTREMITIES:  No increased edema present.  DP pulses palpable and equal bilaterally.          Assessment & Plan:  GI.  Some occasional nausea.  Just evaluated by GI.  Had abdominal ultrasound.  Fatty liver.  No other acute abnormality.  With weight loss and decreased appetite, discussed further w/up including CT scan, etc.  She declines.  She elects to follow.  Avoid foods that aggravate.  Follow.  Get her back in soon to reassess.    LIGHT HEADEDNESS.  Symptoms as outlined.  Will decrease HCTZ to 25mg  1/2 tablet q day.  Follow pressures.  Get her back in soon to reassess.     INCREASED PSYCHOSOCIAL STRESSORS.  Feels she is handling stress well.  Follow.   HEALTH MAINTENANCE.  Physical 03/14/12. .  Colonoscopy 2013.  Was referred to Dr Katrinka Blazing for  abnormal mammogram.  See his note for details.  Pap at physical wnl.

## 2012-07-17 NOTE — Assessment & Plan Note (Signed)
Discussed with her today.  Will check labs including B12 and tsh.  Get her back in soon to reevaluate.

## 2012-07-17 NOTE — Assessment & Plan Note (Signed)
Blood pressure under good control.  Continue Lisinopril and HCTZ.  Follow.  Check metabolic panel with next labs.

## 2012-07-17 NOTE — Assessment & Plan Note (Signed)
Symptoms controlled on Protonix and Zantac.  Follow. Continue follow up with Dr Marva Panda.  Does have the intermittent nausea and decreased appetite.  Desires no further w/up.

## 2012-07-17 NOTE — Assessment & Plan Note (Signed)
Had an abnormal mammogram 03/14/12.  Referred to Dr Katrinka Blazing.  Decided to follow.  Plan for f/u 6 month mammo.

## 2012-07-17 NOTE — Assessment & Plan Note (Signed)
Bowels stable.  

## 2012-07-17 NOTE — Assessment & Plan Note (Signed)
Continue on Lovastatin.  Check lipid panel and liver function with next fasting labs.

## 2012-07-17 NOTE — Assessment & Plan Note (Signed)
Previous TIA.  On aspirin and doing well.  Follow.   

## 2012-07-18 ENCOUNTER — Other Ambulatory Visit: Payer: Self-pay | Admitting: *Deleted

## 2012-07-18 ENCOUNTER — Other Ambulatory Visit: Payer: Self-pay | Admitting: Internal Medicine

## 2012-07-18 DIAGNOSIS — E7800 Pure hypercholesterolemia, unspecified: Secondary | ICD-10-CM

## 2012-07-18 MED ORDER — ATORVASTATIN CALCIUM 10 MG PO TABS: 10.0000 mg | ORAL_TABLET | Freq: Every day | ORAL | Status: DC

## 2012-07-18 NOTE — Progress Notes (Signed)
Order placed for f/u liver panel.  

## 2012-07-19 ENCOUNTER — Ambulatory Visit (INDEPENDENT_AMBULATORY_CARE_PROVIDER_SITE_OTHER)
Admission: RE | Admit: 2012-07-19 | Discharge: 2012-07-19 | Disposition: A | Discharge status: Home / Self Care | Payer: Medicare Other | Source: Ambulatory Visit | Attending: Internal Medicine | Admitting: Internal Medicine

## 2012-07-19 DIAGNOSIS — R29898 Other symptoms and signs involving the musculoskeletal system: Secondary | ICD-10-CM

## 2012-07-20 ENCOUNTER — Telehealth: Payer: Self-pay | Admitting: Internal Medicine

## 2012-07-20 DIAGNOSIS — R29898 Other symptoms and signs involving the musculoskeletal system: Secondary | ICD-10-CM

## 2012-07-20 NOTE — Telephone Encounter (Signed)
Message copied by Charm Barges on Wed Jul 20, 2012  4:06 PM ------      Message from: Warden Fillers      Created: Wed Jul 20, 2012  3:20 PM       Pt notified of xray results & would like to proceed with PT for eval & treatment ------

## 2012-07-20 NOTE — Telephone Encounter (Signed)
Order placed for physical therapy referral.   

## 2012-08-04 ENCOUNTER — Ambulatory Visit (INDEPENDENT_AMBULATORY_CARE_PROVIDER_SITE_OTHER): Payer: Medicare Other | Admitting: Adult Health

## 2012-08-04 ENCOUNTER — Encounter: Payer: Self-pay | Admitting: Adult Health

## 2012-08-04 VITALS — BP 100/56 | HR 89 | Temp 98.7°F | Resp 12 | Wt 146.0 lb

## 2012-08-04 DIAGNOSIS — R1032 Left lower quadrant pain: Secondary | ICD-10-CM

## 2012-08-04 DIAGNOSIS — F439 Reaction to severe stress, unspecified: Secondary | ICD-10-CM | POA: Insufficient documentation

## 2012-08-04 LAB — CBC WITH DIFFERENTIAL/PLATELET
Basophils Relative: 0.3 % (ref 0.0–3.0)
Eosinophils Absolute: 0.2 10*3/uL (ref 0.0–0.7)
Eosinophils Relative: 1.5 % (ref 0.0–5.0)
Lymphocytes Relative: 20.2 % (ref 12.0–46.0)
MCHC: 33.3 g/dL (ref 30.0–36.0)
MCV: 91.9 fl (ref 78.0–100.0)
Monocytes Absolute: 0.8 10*3/uL (ref 0.1–1.0)
Neutrophils Relative %: 71.3 % (ref 43.0–77.0)
Platelets: 256 10*3/uL (ref 150.0–400.0)
RBC: 4.29 Mil/uL (ref 3.87–5.11)
WBC: 12.3 10*3/uL — ABNORMAL HIGH (ref 4.5–10.5)

## 2012-08-04 MED ORDER — CIPROFLOXACIN HCL 500 MG PO TABS: 500.0000 mg | ORAL_TABLET | Freq: Two times a day (BID) | ORAL | Status: DC

## 2012-08-04 MED ORDER — HYDROCODONE-ACETAMINOPHEN 5-325 MG PO TABS: 1.0000 | ORAL_TABLET | Freq: Four times a day (QID) | ORAL | Status: DC | PRN

## 2012-08-04 NOTE — Patient Instructions (Addendum)
  Start Cipro 500 mg twice daily for 10 days.  Drink plenty of fluids.   Diet of clear liquids only today. You can advance the diet as tolerated when your symptoms begin to improve.  I have ordered Norco for pain. This can make you sleepy. Use caution when getting up and moving around.   Norco has tylenol in it. Be mindful of other medication containing tylenol. You should not exceed 3200 mg of tylenol in a 24 hour period.  If your symptoms are not improved by tomorrow please call and I will send you for a CT scan.

## 2012-08-04 NOTE — Progress Notes (Signed)
Subjective:    Patient ID: Anita Santana, female    DOB: 1940-10-26, 72 y.o.   MRN: [id number]  HPI  Patient is a pleasant 73 y/o female with hx of diverticulosis who presents with LLQ pain. Pain started Monday night. She reports that she had been to see physical therapy Monday afternoon for left leg weakness. This was the initial visit. She went to [employer] with her husband afterwards. She was fine that evening. Symptoms began in the middle of the night. She woke up to go to the bathroom and realized she was hurting. Abdomen was distended. Tender to touch. Sharp pain and sore. Reports no diverticular flares in ~ 2 years. She denies any diarrhea or bleeding from rectum. She has not had a BM since Monday and reports BM was normal. She reports temp of 100.2, 100.4 and 100.6. She has taken tylenol for temp and today is afebrile. She is feeling weakness. No lightheadedness or syncopal episodes.  Past Medical History  Diagnosis Date  . GERD (gastroesophageal reflux disease)   . Hypertension   . Hypercholesteremia   . TIA (transient ischemic attack)   . Syncope and collapse   . Diverticulosis of colon 02/08/12    Colonoscopy     Current Outpatient Prescriptions on File Prior to Visit  Medication Sig Dispense Refill  . acetaminophen (TYLENOL ARTHRITIS PAIN) 650 MG CR tablet Take 650 mg by mouth as needed.      Marland Kitchen aspirin 81 MG tablet Take 81 mg by mouth daily.      Marland Kitchen atorvastatin (LIPITOR) 10 MG tablet Take 1 tablet (10 mg total) by mouth daily.  90 tablet  1  . lisinopril (PRINIVIL,ZESTRIL) 40 MG tablet Take 1 tablet (40 mg total) by mouth daily.  90 tablet  1  . pantoprazole (PROTONIX) 40 MG tablet Take 1 tablet (40 mg total) by mouth daily.  90 tablet  1  . Psyllium (METAMUCIL PO) Take by mouth daily.      . ranitidine (ZANTAC) 150 MG tablet Take 150 mg by mouth as needed.      . simethicone (GAS-X) 80 MG chewable tablet Chew 80 mg by mouth as needed.       No current facility-administered  medications on file prior to visit.    Review of Systems  Constitutional: Positive for fever and fatigue.  Respiratory: Negative.   Cardiovascular: Negative.   Gastrointestinal: Positive for abdominal pain, constipation and abdominal distention. Negative for nausea, vomiting, diarrhea, blood in stool and anal bleeding.       Pain LLQ rated 4-5/10. Increases with palpation.  Genitourinary: Negative.   Psychiatric/Behavioral: Positive for sleep disturbance.  All other systems reviewed and are negative.   BP 100/56  Pulse 89  Temp(Src) 98.7 F (37.1 C) (Oral)  Resp 12  Wt 146 lb (66.225 kg)  BMI 24.3 kg/m2  SpO2 95%    Objective:   Physical Exam  Constitutional: She is oriented to person, place, and time. She appears well-developed and well-nourished.  Appears in pain.   HENT:  Head: Normocephalic and atraumatic.  Cardiovascular: Normal rate, regular rhythm, normal heart sounds and intact distal pulses.  Exam reveals no gallop.   No murmur heard. Pulmonary/Chest: Effort normal and breath sounds normal. No respiratory distress. She has no wheezes. She has no rales.  Abdominal: Soft. She exhibits distension. She exhibits no mass. There is tenderness. There is guarding. There is no rebound.  Bowel sounds are hypoactive mainly towards LLQ  Musculoskeletal: Normal range of  motion.  Lymphadenopathy:    She has no cervical adenopathy.  Neurological: She is alert and oriented to person, place, and time.  Skin: Skin is warm and dry.  Psychiatric: She has a normal mood and affect. Her behavior is normal. Judgment and thought content normal.      Assessment & Plan:

## 2012-08-04 NOTE — Assessment & Plan Note (Signed)
Symptoms suggest diverticular flare. Check CBC with differential. Start Cipro 500 mg twice a day x10 days. Patient does not tolerate Flagyl very well so this will not be added this at this time. If symptoms worsen or fail to show some improvement by tomorrow I will send her for CT of the abdomen. Clear liquid diet for today. Advance diet to low residue as tolerated starting tomorrow.

## 2012-08-08 ENCOUNTER — Ambulatory Visit (INDEPENDENT_AMBULATORY_CARE_PROVIDER_SITE_OTHER): Payer: Medicare Other | Admitting: Adult Health

## 2012-08-08 ENCOUNTER — Encounter: Payer: Self-pay | Admitting: Adult Health

## 2012-08-08 ENCOUNTER — Ambulatory Visit: Payer: Self-pay | Admitting: Adult Health

## 2012-08-08 ENCOUNTER — Telehealth: Payer: Self-pay | Admitting: Internal Medicine

## 2012-08-08 VITALS — BP 112/60 | HR 86 | Temp 98.2°F | Resp 12 | Wt 146.0 lb

## 2012-08-08 DIAGNOSIS — R1032 Left lower quadrant pain: Secondary | ICD-10-CM

## 2012-08-08 DIAGNOSIS — K5730 Diverticulosis of large intestine without perforation or abscess without bleeding: Secondary | ICD-10-CM

## 2012-08-08 LAB — POCT URINALYSIS DIPSTICK
Leukocytes, UA: NEGATIVE
Nitrite, UA: NEGATIVE
Protein, UA: NEGATIVE
Urobilinogen, UA: 0.2

## 2012-08-08 LAB — URINALYSIS
Ketones, ur: NEGATIVE
Specific Gravity, Urine: 1.025 (ref 1.000–1.030)
Urine Glucose: NEGATIVE
Urobilinogen, UA: 0.2 (ref 0.0–1.0)

## 2012-08-08 LAB — BASIC METABOLIC PANEL
BUN: 12 mg/dL (ref 6–23)
Calcium: 9.6 mg/dL (ref 8.4–10.5)
Creatinine, Ser: 0.9 mg/dL (ref 0.4–1.2)
GFR: 66.31 mL/min (ref >60.00)
Potassium: 3.8 mEq/L (ref 3.5–5.1)

## 2012-08-08 LAB — CBC WITH DIFFERENTIAL/PLATELET
Basophils Relative: 0.2 % (ref 0.0–3.0)
Eosinophils Absolute: 0.1 10*3/uL (ref 0.0–0.7)
MCHC: 33.5 g/dL (ref 30.0–36.0)
MCV: 91.7 fl (ref 78.0–100.0)
Monocytes Absolute: 0.8 10*3/uL (ref 0.1–1.0)
Neutrophils Relative %: 84.3 % — ABNORMAL HIGH (ref 43.0–77.0)
RBC: 4.24 Mil/uL (ref 3.87–5.11)

## 2012-08-08 NOTE — Patient Instructions (Addendum)
  I am sending you for CT of the abdomen to evaluate you abdominal pain.  We will contact you once we obtain results.  Clear liquid diet for today and tomorrow.  Start with bland, easy to digest foods such as toast, rice, plain potato, chicken soup, etc.   Continue antibiotics

## 2012-08-08 NOTE — Assessment & Plan Note (Signed)
Patient was doing well on Friday but then symptoms began to start up again Friday evening. Suspect it was aggravated by her diet. I will send her for CT of abdomen and also repeat CBC to see if WBC improving with Cipro. Instructed patient to stay on clear liquids for 2 days and then start with bland foods such as plain potato, chicken soup, rice, toast. Adjust plan as necessary based on CT findings. 

## 2012-08-08 NOTE — Assessment & Plan Note (Deleted)
Patient was doing well on Friday but then symptoms began to start up again Friday evening. Suspect it was aggravated by her diet. I will send her for CT of abdomen and also repeat CBC to see if WBC improving with Cipro. Instructed patient to stay on clear liquids for 2 days and then start with bland foods such as plain potato, chicken soup, rice, toast. Adjust plan as necessary based on CT findings.

## 2012-08-08 NOTE — Telephone Encounter (Signed)
Pt seen in office today. CT results showed diverticulitis. Per Raquel, advised to continue antibiotics. Clear liquid diet x 2 days, then progress to a bland diet. Advised to call back if symptoms persist or worsen. Pt notified and verbalized understanding.

## 2012-08-08 NOTE — Telephone Encounter (Signed)
Pt still having problems from diverticulitis.  Was seen by Raquel on Thursday last week.  Has made an appt with Raquel today 10:15 a.m.  Would like a call back if Raquel would like her to go ahead and get CT scan as recommended last week if she was no better.  Pt states she has abdominal pain, trouble urinating.  Took a pain pill this morning.

## 2012-08-08 NOTE — Progress Notes (Signed)
  Subjective:    Patient ID: Anita Santana, female    DOB: 1940-12-30, 72 y.o.   MRN: [id number]  HPI  Patient presents to clinic with ongoing LLQ pain. She was seen in clinic on 08/04/12 for this problem and started on Cipro. She was asked to maintain a clear liquid diet and progress slowly to bland foods, low residue as tolerated. Nurse called patient on Friday and she reported increased improvement in her symptoms. Patient denies blood in her stool, fever. Her WBC was elevated. Patient reports that she maintained the clear liquid diet on Thursday. On Friday evening she ate fried fish and then had hamburger over the weekend. She reports that she did not maintain an easy to digest, low residue diet.   Current Outpatient Prescriptions on File Prior to Visit  Medication Sig Dispense Refill  . acetaminophen (TYLENOL ARTHRITIS PAIN) 650 MG CR tablet Take 650 mg by mouth as needed.      Marland Kitchen aspirin 81 MG tablet Take 81 mg by mouth daily.      Marland Kitchen atorvastatin (LIPITOR) 10 MG tablet Take 1 tablet (10 mg total) by mouth daily.  90 tablet  1  . ciprofloxacin (CIPRO) 500 MG tablet Take 1 tablet (500 mg total) by mouth 2 (two) times daily.  20 tablet  0  . hydrochlorothiazide (HYDRODIURIL) 25 MG tablet Take 25 mg by mouth daily. Taking 1/2 pill daily (12.5mg )      . HYDROcodone-acetaminophen (NORCO/VICODIN) 5-325 MG per tablet Take 1 tablet by mouth every 6 (six) hours as needed for pain.  30 tablet  0  . lisinopril (PRINIVIL,ZESTRIL) 40 MG tablet Take 1 tablet (40 mg total) by mouth daily.  90 tablet  1  . pantoprazole (PROTONIX) 40 MG tablet Take 1 tablet (40 mg total) by mouth daily.  90 tablet  1  . Psyllium (METAMUCIL PO) Take by mouth daily.      . ranitidine (ZANTAC) 150 MG tablet Take 150 mg by mouth as needed.      . simethicone (GAS-X) 80 MG chewable tablet Chew 80 mg by mouth as needed.       No current facility-administered medications on file prior to visit.    Review of Systems   Constitutional: Negative for fever and chills.  Respiratory: Negative.   Cardiovascular: Negative.   Gastrointestinal: Positive for abdominal pain. Negative for nausea, vomiting, diarrhea, constipation, blood in stool and rectal pain.  Genitourinary: Negative.        Objective:   Physical Exam  Constitutional: She is oriented to person, place, and time. She appears well-developed and well-nourished. No distress.  Cardiovascular: Normal rate and regular rhythm.   Pulmonary/Chest: Effort normal. No respiratory distress.  Abdominal: Soft. Bowel sounds are normal. There is tenderness.  Tenderness over RLQ and LLQ. Patient with hx of diverticular dz throughout colon. Seen on colonoscopy 02/2012.  Neurological: She is alert and oriented to person, place, and time.  Skin: Skin is warm and dry.  Psychiatric: She has a normal mood and affect. Her behavior is normal. Judgment and thought content normal.          Assessment & Plan:

## 2012-08-09 LAB — URINE CULTURE: Colony Count: NO GROWTH

## 2012-09-06 ENCOUNTER — Encounter: Payer: Self-pay | Admitting: *Deleted

## 2012-09-07 ENCOUNTER — Telehealth: Payer: Self-pay | Admitting: *Deleted

## 2012-09-07 ENCOUNTER — Ambulatory Visit (INDEPENDENT_AMBULATORY_CARE_PROVIDER_SITE_OTHER): Payer: Medicare Other | Admitting: Internal Medicine

## 2012-09-07 ENCOUNTER — Encounter: Payer: Self-pay | Admitting: Internal Medicine

## 2012-09-07 VITALS — BP 122/70 | HR 64 | Temp 97.9°F | Ht 65.0 in | Wt 146.5 lb

## 2012-09-07 DIAGNOSIS — I10 Essential (primary) hypertension: Secondary | ICD-10-CM

## 2012-09-07 DIAGNOSIS — E7800 Pure hypercholesterolemia, unspecified: Secondary | ICD-10-CM

## 2012-09-07 DIAGNOSIS — D72829 Elevated white blood cell count, unspecified: Secondary | ICD-10-CM

## 2012-09-07 DIAGNOSIS — K5730 Diverticulosis of large intestine without perforation or abscess without bleeding: Secondary | ICD-10-CM

## 2012-09-07 DIAGNOSIS — G459 Transient cerebral ischemic attack, unspecified: Secondary | ICD-10-CM

## 2012-09-07 DIAGNOSIS — E785 Hyperlipidemia, unspecified: Secondary | ICD-10-CM

## 2012-09-07 DIAGNOSIS — K219 Gastro-esophageal reflux disease without esophagitis: Secondary | ICD-10-CM

## 2012-09-07 DIAGNOSIS — R413 Other amnesia: Secondary | ICD-10-CM

## 2012-09-07 DIAGNOSIS — Z23 Encounter for immunization: Secondary | ICD-10-CM

## 2012-09-07 DIAGNOSIS — R928 Other abnormal and inconclusive findings on diagnostic imaging of breast: Secondary | ICD-10-CM

## 2012-09-07 NOTE — Telephone Encounter (Signed)
Labs ordered.

## 2012-09-07 NOTE — Telephone Encounter (Signed)
What  Labs and dx?

## 2012-09-08 LAB — CBC WITH DIFFERENTIAL/PLATELET
Basophils Relative: 0.7 % (ref 0.0–3.0)
Eosinophils Absolute: 0.2 10*3/uL (ref 0.0–0.7)
Eosinophils Relative: 3.6 % (ref 0.0–5.0)
HCT: 38.3 % (ref 36.0–46.0)
Lymphs Abs: 2 10*3/uL (ref 0.7–4.0)
MCHC: 33.5 g/dL (ref 30.0–36.0)
MCV: 90.1 fl (ref 78.0–100.0)
Monocytes Absolute: 0.3 10*3/uL (ref 0.1–1.0)
Neutrophils Relative %: 59.9 % (ref 43.0–77.0)
Platelets: 220 10*3/uL (ref 150.0–400.0)
RBC: 4.25 Mil/uL (ref 3.87–5.11)

## 2012-09-08 LAB — HEPATIC FUNCTION PANEL
ALT: 15 U/L (ref 0–35)
AST: 22 U/L (ref 0–37)
Bilirubin, Direct: 0.1 mg/dL (ref 0.0–0.3)
Total Bilirubin: 0.8 mg/dL (ref 0.3–1.2)

## 2012-09-09 ENCOUNTER — Encounter: Payer: Self-pay | Admitting: *Deleted

## 2012-09-10 ENCOUNTER — Encounter: Payer: Self-pay | Admitting: Internal Medicine

## 2012-09-10 NOTE — Assessment & Plan Note (Signed)
Continue on Lovastatin.  Check lipid panel and liver function with next fasting labs.      

## 2012-09-10 NOTE — Assessment & Plan Note (Signed)
Had an abnormal mammogram 03/14/12.  Referred to Dr Smith.  Decided to follow.  Plan for f/u 6 month mammo.   

## 2012-09-10 NOTE — Progress Notes (Signed)
Subjective:    Patient ID: Anita Santana, female    DOB: 07/28/1940, 72 y.o.   MRN: [id number]  HPI 72 year old female with past history of hypertension, hypercholesterolemia, GERD and previously presumed TIA.  She comes in today for a scheduled follow up.  She states she is doing relatively well. Reports the light headedness is better.  No headache.  She is going to PT for gait training and strengthening.  Is helping.  Memory - she feels is stable.  Desires no further intervention at this time.  No chest pain or tightness.  Breathing stable.  Eating and drinking well.  Bowels stable.  No nausea or vomiting.  Has lost some more weight.       Past Medical History  Diagnosis Date  . GERD (gastroesophageal reflux disease)   . Hypertension   . Hypercholesteremia   . TIA (transient ischemic attack)   . Syncope and collapse   . Diverticulosis of colon 02/08/12    Colonoscopy    Current Outpatient Prescriptions on File Prior to Visit  Medication Sig Dispense Refill  . acetaminophen (TYLENOL ARTHRITIS PAIN) 650 MG CR tablet Take 650 mg by mouth as needed.      Marland Kitchen aspirin 81 MG tablet Take 81 mg by mouth daily.      Marland Kitchen atorvastatin (LIPITOR) 10 MG tablet Take 1 tablet (10 mg total) by mouth daily.  90 tablet  1  . hydrochlorothiazide (HYDRODIURIL) 25 MG tablet Take 25 mg by mouth daily. Taking 1/2 pill daily (12.5mg )      . lisinopril (PRINIVIL,ZESTRIL) 40 MG tablet Take 1 tablet (40 mg total) by mouth daily.  90 tablet  1  . pantoprazole (PROTONIX) 40 MG tablet Take 1 tablet (40 mg total) by mouth daily.  90 tablet  1  . Psyllium (METAMUCIL PO) Take by mouth daily.      . ranitidine (ZANTAC) 150 MG tablet Take 150 mg by mouth as needed.      . simethicone (GAS-X) 80 MG chewable tablet Chew 80 mg by mouth as needed.       No current facility-administered medications on file prior to visit.    Review of Systems Patient denies any headache.  Lightheadedness better since stopping the hctz.   No  sinus or allergy symptoms.  No chest pain, tightness or palpitations.  No increased shortness of breath, cough or congestion.  No vomiting.  No nausea.  Eating and drinking well.  No acid reflux.  No abdominal pain or cramping.  No bowel change, such as diarrhea, constipation, BRBPR or melana.  No urine change.  Feels she is handling stress relatively well.      Objective:   Physical Exam  Filed Vitals:   09/07/12 0900  BP: 122/70  Pulse: 64  Temp: 97.9 F (36.6 C)   Pulse recheck 101  72 year old female in no acute distress.   HEENT:  Nares- clear.  Oropharynx - without lesions. NECK:  Supple.  Nontender.  No audible bruit.  HEART:  Appears to be regular. LUNGS:  No crackles or wheezing audible.  Respirations even and unlabored.  RADIAL PULSE:  Equal bilaterally.  ABDOMEN:  Soft, nontender.  Bowel sounds present and normal.  No audible abdominal bruit.    EXTREMITIES:  No increased edema present.  DP pulses palpable and equal bilaterally.          Assessment & Plan:  GI.  Just evaluated by GI.  Had abdominal ultrasound.  Fatty  liver.  No other acute abnormality.  Recent CT revealed diverticulitis.  With the weight loss, have discussed further w/up.  Wants to monitor.  Will follow.    LIGHT HEADEDNESS.  Decreased HCTZ to 25mg  1/2 tablet q day last visit.  Pressure dong well.  Follow.  Light headedness has resolved.     INCREASED PSYCHOSOCIAL STRESSORS.  Feels she is handling stress well.  Follow.   HEALTH MAINTENANCE.  Physical 03/14/12. .  Colonoscopy 2013.  Was referred to Dr Katrinka Blazing for abnormal mammogram.  See his note for details.  Pap at physical wnl.

## 2012-09-10 NOTE — Assessment & Plan Note (Signed)
Recent diverticular flare.  Treated.  Resolved.

## 2012-09-10 NOTE — Assessment & Plan Note (Signed)
Previous TIA.  On aspirin and doing well.  Follow.   

## 2012-09-10 NOTE — Assessment & Plan Note (Signed)
Blood pressure appears to be doing well.  Off hctz.   Follow.  Check metabolic panel with next labs.

## 2012-09-10 NOTE — Assessment & Plan Note (Signed)
Symptoms controlled on Protonix and Zantac.  Follow. Continue follow up with Dr Skulskie.  Denies any nausea or vomiting.  States eating and drinking well.  Follow.      

## 2012-09-10 NOTE — Assessment & Plan Note (Signed)
Did well on a brief mini mental status check today.  Recent labs ok.  Discussed further w/up and treatment.  Will follow.

## 2012-09-12 ENCOUNTER — Encounter: Payer: Self-pay | Admitting: Adult Health

## 2012-09-26 ENCOUNTER — Other Ambulatory Visit: Payer: Self-pay | Admitting: *Deleted

## 2012-09-26 MED ORDER — LISINOPRIL 40 MG PO TABS: 40.0000 mg | ORAL_TABLET | Freq: Every day | ORAL | Status: DC

## 2012-09-26 MED ORDER — PANTOPRAZOLE SODIUM 40 MG PO TBEC: 40.0000 mg | DELAYED_RELEASE_TABLET | Freq: Every day | ORAL | Status: DC

## 2012-12-12 ENCOUNTER — Other Ambulatory Visit: Payer: Self-pay | Admitting: *Deleted

## 2012-12-12 MED ORDER — HYDROCHLOROTHIAZIDE 25 MG PO TABS: 12.5000 mg | ORAL_TABLET | Freq: Every day | ORAL | Status: DC

## 2013-01-12 ENCOUNTER — Telehealth: Payer: Self-pay | Admitting: Emergency Medicine

## 2013-01-12 NOTE — Telephone Encounter (Signed)
[employer] referral has been filled out and faxed for approved visits.

## 2013-01-26 NOTE — Telephone Encounter (Signed)
[employer] approved 4 visits, exp 04/12/13. Auth # [id number]

## 2013-01-27 ENCOUNTER — Telehealth: Payer: Self-pay | Admitting: *Deleted

## 2013-01-27 DIAGNOSIS — E7800 Pure hypercholesterolemia, unspecified: Secondary | ICD-10-CM

## 2013-01-27 DIAGNOSIS — I10 Essential (primary) hypertension: Secondary | ICD-10-CM

## 2013-01-27 NOTE — Telephone Encounter (Signed)
Pt is coming in for labs on  Monday what labs and dx? 

## 2013-01-27 NOTE — Telephone Encounter (Signed)
Labs ordered.

## 2013-01-30 ENCOUNTER — Other Ambulatory Visit (INDEPENDENT_AMBULATORY_CARE_PROVIDER_SITE_OTHER): Payer: Medicare HMO

## 2013-01-30 DIAGNOSIS — E7800 Pure hypercholesterolemia, unspecified: Secondary | ICD-10-CM

## 2013-01-30 DIAGNOSIS — I10 Essential (primary) hypertension: Secondary | ICD-10-CM

## 2013-01-30 LAB — LIPID PANEL
Cholesterol: 190 mg/dL (ref 0–200)
HDL: 58 mg/dL (ref >39.00)
LDL Cholesterol: 98 mg/dL (ref 0–99)
TRIGLYCERIDES: 171 mg/dL — AB (ref 0.0–149.0)
Total CHOL/HDL Ratio: 3
VLDL: 34.2 mg/dL (ref 0.0–40.0)

## 2013-01-30 LAB — BASIC METABOLIC PANEL
BUN: 15 mg/dL (ref 6–23)
CALCIUM: 9.5 mg/dL (ref 8.4–10.5)
CO2: 30 meq/L (ref 19–32)
CREATININE: 0.8 mg/dL (ref 0.4–1.2)
Chloride: 102 mEq/L (ref 96–112)
GFR: 71.77 mL/min (ref >60.00)
Glucose, Bld: 89 mg/dL (ref 70–99)
Potassium: 4.5 mEq/L (ref 3.5–5.1)
Sodium: 139 mEq/L (ref 135–145)

## 2013-01-30 LAB — HEPATIC FUNCTION PANEL
ALBUMIN: 4.4 g/dL (ref 3.5–5.2)
ALT: 16 U/L (ref 0–35)
AST: 20 U/L (ref 0–37)
Alkaline Phosphatase: 76 U/L (ref 39–117)
BILIRUBIN DIRECT: 0.1 mg/dL (ref 0.0–0.3)
Total Bilirubin: 1 mg/dL (ref 0.3–1.2)
Total Protein: 7.3 g/dL (ref 6.0–8.3)

## 2013-02-02 ENCOUNTER — Encounter: Payer: Self-pay | Admitting: Internal Medicine

## 2013-02-02 ENCOUNTER — Ambulatory Visit (INDEPENDENT_AMBULATORY_CARE_PROVIDER_SITE_OTHER): Payer: Medicare HMO | Admitting: Internal Medicine

## 2013-02-02 VITALS — BP 130/70 | HR 58 | Temp 97.9°F | Ht 65.0 in | Wt 148.5 lb

## 2013-02-02 DIAGNOSIS — R413 Other amnesia: Secondary | ICD-10-CM

## 2013-02-02 DIAGNOSIS — E7800 Pure hypercholesterolemia, unspecified: Secondary | ICD-10-CM

## 2013-02-02 DIAGNOSIS — R928 Other abnormal and inconclusive findings on diagnostic imaging of breast: Secondary | ICD-10-CM

## 2013-02-02 DIAGNOSIS — G459 Transient cerebral ischemic attack, unspecified: Secondary | ICD-10-CM

## 2013-02-02 DIAGNOSIS — I10 Essential (primary) hypertension: Secondary | ICD-10-CM

## 2013-02-02 DIAGNOSIS — K219 Gastro-esophageal reflux disease without esophagitis: Secondary | ICD-10-CM

## 2013-02-02 DIAGNOSIS — K5730 Diverticulosis of large intestine without perforation or abscess without bleeding: Secondary | ICD-10-CM

## 2013-02-02 NOTE — Progress Notes (Signed)
Subjective:    Patient ID: Anita Santana, female    DOB: 03-12-1940, 73 y.o.   MRN: [id number]  HPI 73 year old female with past history of hypertension, hypercholesterolemia, GERD and previously presumed TIA.  She comes in today for a scheduled follow up.  She states she is doing relatively well.   No headache.  No lightheadedness or dizziness reported.   Memory - she feels is stable.  Desires no further intervention at this time.  No chest pain or tightness. Breathing stable.  Eating and drinking well.  Bowels stable.  No nausea or vomiting.         Past Medical History  Diagnosis Date  . GERD (gastroesophageal reflux disease)   . Hypertension   . Hypercholesteremia   . TIA (transient ischemic attack)   . Syncope and collapse   . Diverticulosis of colon 02/08/12    Colonoscopy    Current Outpatient Prescriptions on File Prior to Visit  Medication Sig Dispense Refill  . acetaminophen (TYLENOL ARTHRITIS PAIN) 650 MG CR tablet Take 650 mg by mouth as needed.      Marland Kitchen. aspirin 81 MG tablet Take 81 mg by mouth daily.      Marland Kitchen. atorvastatin (LIPITOR) 10 MG tablet Take 1 tablet (10 mg total) by mouth daily.  90 tablet  1  . hydrochlorothiazide (HYDRODIURIL) 25 MG tablet Take 0.5 tablets (12.5 mg total) by mouth daily.  45 tablet  1  . lisinopril (PRINIVIL,ZESTRIL) 40 MG tablet Take 1 tablet (40 mg total) by mouth daily.  90 tablet  1  . pantoprazole (PROTONIX) 40 MG tablet Take 1 tablet (40 mg total) by mouth daily.  90 tablet  1  . Psyllium (METAMUCIL PO) Take by mouth daily.      . ranitidine (ZANTAC) 150 MG tablet Take 150 mg by mouth as needed.      . simethicone (GAS-X) 80 MG chewable tablet Chew 80 mg by mouth as needed.       No current facility-administered medications on file prior to visit.    Review of Systems Patient denies any headache.  No light headedness or dizziness reported.  No sinus or allergy symptoms.  No chest pain, tightness or palpitations.  No increased shortness of  breath, cough or congestion.  No vomiting.  No nausea.  Eating and drinking well.  No acid reflux.  No abdominal pain or cramping.  No bowel change, such as diarrhea, constipation, BRBPR or melana.  No urine change.  Feels she is handling stress relatively well.      Objective:   Physical Exam  Filed Vitals:   02/02/13 0824  BP: 130/70  Pulse: 58  Temp: 97.9 F (36.6 C)   Blood pressure recheck:  89132/5072  73 year old female in no acute distress.   HEENT:  Nares- clear.  Oropharynx - without lesions. NECK:  Supple.  Nontender.  No audible bruit.  HEART:  Appears to be regular. LUNGS:  No crackles or wheezing audible.  Respirations even and unlabored.  RADIAL PULSE:  Equal bilaterally.  ABDOMEN:  Soft, nontender.  Bowel sounds present and normal.  No audible abdominal bruit.    EXTREMITIES:  No increased edema present.  DP pulses palpable and equal bilaterally.          Assessment & Plan:  GI.  Just evaluated by GI.  Had abdominal ultrasound.  Fatty liver.  No other acute abnormality.  Recent CT revealed diverticulitis.  Weight up a couple of  pounds from last check.  Follow.    LIGHT HEADEDNESS.  Decreased HCTZ to 25mg  1/2 tablet q day previously.    Pressure dong well.  Follow.  Light headedness has resolved.     INCREASED PSYCHOSOCIAL STRESSORS.  Feels she is handling stress well.  Follow.   HEALTH MAINTENANCE.  Physical 03/14/12. .  Colonoscopy 2013.  Was referred to Dr Katrinka Blazing for abnormal mammogram.  See his note for details.  Overdue f/u mammogram.  Schedule.  Pap at physical wnl.

## 2013-02-02 NOTE — Progress Notes (Signed)
Pre-visit discussion using our clinic review tool. No additional management support is needed unless otherwise documented below in the visit note.  

## 2013-02-05 ENCOUNTER — Encounter: Payer: Self-pay | Admitting: Internal Medicine

## 2013-02-05 NOTE — Assessment & Plan Note (Signed)
Recent labs ok.  Have discussed further w/up and treatment.  Will follow.

## 2013-02-05 NOTE — Assessment & Plan Note (Signed)
Blood pressure appears to be doing well.  Off hctz.   Follow.  Follow metabolic panel.    

## 2013-02-05 NOTE — Assessment & Plan Note (Signed)
Previous TIA.  On aspirin and doing well.  Follow.   

## 2013-02-05 NOTE — Assessment & Plan Note (Signed)
Had an abnormal mammogram 03/14/12.  Referred to Dr Katrinka BlazingSmith.  Decided to follow.  Plan was  for f/u 6 month mammo.  Has not had the f/u mammogram yet.  Schedule.

## 2013-02-05 NOTE — Assessment & Plan Note (Signed)
Currently doing well.  Follow.  

## 2013-02-05 NOTE — Assessment & Plan Note (Signed)
Symptoms controlled on Protonix and Zantac.  Follow. Continue follow up with Dr Marva PandaSkulskie.  Denies any nausea or vomiting.  States eating and drinking well.  Follow.

## 2013-02-05 NOTE — Assessment & Plan Note (Signed)
Continue on Lovastatin.  Follow lipid panel and liver function.

## 2013-02-09 ENCOUNTER — Telehealth: Payer: Self-pay | Admitting: Internal Medicine

## 2013-02-09 NOTE — Telephone Encounter (Signed)
This has been ordered.  Will you let pt know if waiting on something.  Thanks.

## 2013-02-09 NOTE — Telephone Encounter (Signed)
Needing a mammogram appointment.

## 2013-02-09 NOTE — Telephone Encounter (Signed)
This has been printed. I have not scheduled it yet.

## 2013-02-10 NOTE — Telephone Encounter (Signed)
Anita Santana scheduled this way because she is going to be due for both breasts within two months. If they scheduled sooner she will have to come back to do a [facility] mammogram. Please advise.

## 2013-02-10 NOTE — Telephone Encounter (Signed)
[place] 03/14/13 at 1020. Pt aware of apt

## 2013-02-10 NOTE — Telephone Encounter (Signed)
Noted.  Just let pt know if delay.  Thanks.

## 2013-02-10 NOTE — Telephone Encounter (Signed)
The earlier they can do is 03/02/13 at 11am. Pt r/s for that day.

## 2013-02-10 NOTE — Telephone Encounter (Signed)
Is there any way to get this any earlier.  She is overdue the 6 month f/u.  I hate to put out another month.  Thanks.

## 2013-02-20 ENCOUNTER — Other Ambulatory Visit: Payer: Self-pay | Admitting: *Deleted

## 2013-02-20 ENCOUNTER — Telehealth: Payer: Self-pay | Admitting: Internal Medicine

## 2013-02-20 MED ORDER — ATORVASTATIN CALCIUM 10 MG PO TABS: 10.0000 mg | ORAL_TABLET | Freq: Every day | ORAL | Status: DC

## 2013-02-20 MED ORDER — PANTOPRAZOLE SODIUM 40 MG PO TBEC: 40.0000 mg | DELAYED_RELEASE_TABLET | Freq: Every day | ORAL | Status: DC

## 2013-02-20 MED ORDER — LISINOPRIL 40 MG PO TABS: 40.0000 mg | ORAL_TABLET | Freq: Every day | ORAL | Status: DC

## 2013-02-20 MED ORDER — HYDROCHLOROTHIAZIDE 25 MG PO TABS: 12.5000 mg | ORAL_TABLET | Freq: Every day | ORAL | Status: DC

## 2013-02-20 NOTE — Telephone Encounter (Signed)
Rx's sent to [facility] 

## 2013-02-20 NOTE — Telephone Encounter (Signed)
lisinopril (PRINIVIL,ZESTRIL) 40 MG tablet  hydrochlorothiazide (HYDRODIURIL) 25 MG tablet   atorvastatin (LIPITOR) 10 MG tablet   pantoprazole (PROTONIX) 40 MG tablet

## 2013-03-15 ENCOUNTER — Encounter: Payer: Self-pay | Admitting: Adult Health

## 2013-03-15 ENCOUNTER — Ambulatory Visit: Payer: Self-pay | Admitting: Adult Health

## 2013-03-15 ENCOUNTER — Telehealth: Payer: Self-pay | Admitting: Internal Medicine

## 2013-03-15 ENCOUNTER — Ambulatory Visit (INDEPENDENT_AMBULATORY_CARE_PROVIDER_SITE_OTHER): Payer: Commercial Managed Care - HMO | Admitting: Adult Health

## 2013-03-15 VITALS — BP 122/70 | HR 64 | Temp 99.1°F | Resp 12 | Wt 149.0 lb

## 2013-03-15 DIAGNOSIS — M25519 Pain in unspecified shoulder: Secondary | ICD-10-CM

## 2013-03-15 DIAGNOSIS — M25511 Pain in right shoulder: Secondary | ICD-10-CM

## 2013-03-15 MED ORDER — HYDROCODONE-ACETAMINOPHEN 5-325 MG PO TABS: 1.0000 | ORAL_TABLET | Freq: Four times a day (QID) | ORAL | Status: DC | PRN

## 2013-03-15 NOTE — Progress Notes (Signed)
Pre visit review using our clinic review tool, if applicable. No additional management support is needed unless otherwise documented below in the visit note. 

## 2013-03-15 NOTE — Telephone Encounter (Signed)
Patient Information:  Caller Name: Steward DroneBrenda  Phone: [phone]  Patient: Anita Santana, [name]  Gender: Female  DOB: November 16, 1940  Age: 73 Years  PCP: Dale DurhamScott, Charlene  Office Follow Up:  Does the office need to follow up with this patient?: Yes  Instructions For The Office: Patient needs further instructions.  RN Note:  Please contact patient to let her know if she can be worked into the schedule at this [facility]  Symptoms  Reason For Call & Symptoms: Right shoulder pain; no inury reported. Patient reports there feels like there may be a knot on the front of the shoulder and pain radiates towards the front of the collar bone.  Reviewed Health History In EMR: Yes  Reviewed Medications In EMR: Yes  Reviewed Allergies In EMR: Yes  Reviewed Surgeries / Procedures: Yes  Date of Onset of Symptoms: 03/14/2013  Treatments Tried: Aspercreme  Treatments Tried Worked: Yes  Guideline(s) Used:  Shoulder Pain  Disposition Per Guideline:   Go to Office Now  Reason For Disposition Reached:   Severe pain (e.g., excruciating, unable to do any normal activities)  Advice Given:  N/A  Patient Will Follow Care Advice:  YES

## 2013-03-15 NOTE — Telephone Encounter (Signed)
Pt coming in today to see Raquel @ 3:30

## 2013-03-15 NOTE — Telephone Encounter (Signed)
Since in severe pain, agree with evaluation.  Since I am not in office this pm can see if Raquel has opening or can refer to ortho for acute visit.  May save her two trips.

## 2013-03-15 NOTE — Progress Notes (Signed)
   Subjective:    Patient ID: Anita ManningBrenda Santana, female    DOB: Aug 14, 1940, 73 y.o.   MRN: [id number]  HPI  Pt is a pleasant 73 y/o female who presents to clinic with right shoulder pain that began yesterday. She denies injury. She reports having a knot in the front of her shoulder. Radiates towards the front collar bone. Her husband is with her and reports that she moved a heavy planter. She reports that she did not lift it; she rolled it.   Review of Systems  Musculoskeletal:       Pain in right shoulder x 1 day; decreased ROM   All other systems reviewed and are negative.       Objective:   Physical Exam  Constitutional: She is oriented to person, place, and time. She appears well-developed and well-nourished.  Appears uncomfortable  Cardiovascular: Intact distal pulses.   Pulmonary/Chest: Effort normal. No respiratory distress.  Musculoskeletal: She exhibits tenderness.  Decreased ROM of right arm with abduction. Clavicle on the right is prominent and painful to touch.  Neurological: She is alert and oriented to person, place, and time.  Psychiatric: She has a normal mood and affect. Her behavior is normal. Judgment and thought content normal.       Assessment & Plan:   1. Right shoulder pain Suspect that she might have injured her shoulder while moving the heavy plantar. She has significant pain with abduction of the arm. The clavicle on the right is very prominent. I will send her for an xray to r/o dislocation. Immobilize with sling. Hydrocodone for pain as directed. Once I obtain results from xray, consider referral to Ortho.  - DG Shoulder Right; Future

## 2013-03-15 NOTE — Telephone Encounter (Signed)
Please advise 

## 2013-03-16 ENCOUNTER — Telehealth: Payer: Self-pay | Admitting: Internal Medicine

## 2013-03-16 ENCOUNTER — Telehealth: Payer: Self-pay | Admitting: *Deleted

## 2013-03-16 NOTE — Telephone Encounter (Signed)
Patient wanting x-ray results

## 2013-03-16 NOTE — Telephone Encounter (Signed)
Pt called wanting xray results

## 2013-03-16 NOTE — Telephone Encounter (Signed)
This was ordered by Raquel

## 2013-03-17 NOTE — Telephone Encounter (Signed)
Pt notified that there was no fracture or dislocation. X ray showed arthritis. Advised per Raquel to ice that shoulder, keep immobilized in the sling, and use her hydrocodone as needed. Advised to call back on Monday if no improvement and can refer to orthopedic doctor.

## 2013-03-17 NOTE — Telephone Encounter (Signed)
The patient called again wanting xray results.

## 2013-03-20 ENCOUNTER — Other Ambulatory Visit: Payer: Self-pay | Admitting: Adult Health

## 2013-03-20 ENCOUNTER — Telehealth: Payer: Self-pay | Admitting: Internal Medicine

## 2013-03-20 DIAGNOSIS — M25511 Pain in right shoulder: Secondary | ICD-10-CM

## 2013-03-20 NOTE — Telephone Encounter (Signed)
Pt notified that we will call her as soon as we can get it scheduled. Pt would like an appt ASAP

## 2013-03-20 NOTE — Telephone Encounter (Signed)
The patient was advised to call back on Monday if no improvement and that she would be refered to orthopedic doctor.  She is not feeling any better .

## 2013-03-20 NOTE — Telephone Encounter (Signed)
The patient called back she anxious to receive a response from the physician . She would like to talk with Dr. Lorin PicketScott also.

## 2013-04-07 ENCOUNTER — Ambulatory Visit: Payer: Self-pay | Admitting: Internal Medicine

## 2013-04-07 LAB — HM MAMMOGRAPHY

## 2013-04-10 ENCOUNTER — Encounter: Payer: Self-pay | Admitting: Internal Medicine

## 2013-04-11 ENCOUNTER — Encounter: Payer: Self-pay | Admitting: Adult Health

## 2013-04-20 ENCOUNTER — Telehealth: Payer: Self-pay | Admitting: Emergency Medicine

## 2013-04-20 NOTE — Telephone Encounter (Signed)
Referral underway for [employer] for approval for pt to see Dr. Cheree DittoGraham

## 2013-04-21 NOTE — Telephone Encounter (Signed)
Pt approved to see Cheree DittoGraham with 4 visits exp 08/01/13. Auth # [id number]

## 2013-06-01 ENCOUNTER — Telehealth: Payer: Self-pay | Admitting: *Deleted

## 2013-06-01 ENCOUNTER — Other Ambulatory Visit (INDEPENDENT_AMBULATORY_CARE_PROVIDER_SITE_OTHER): Payer: Commercial Managed Care - HMO

## 2013-06-01 DIAGNOSIS — E7800 Pure hypercholesterolemia, unspecified: Secondary | ICD-10-CM

## 2013-06-01 DIAGNOSIS — I10 Essential (primary) hypertension: Secondary | ICD-10-CM

## 2013-06-01 DIAGNOSIS — R413 Other amnesia: Secondary | ICD-10-CM

## 2013-06-01 LAB — HEPATIC FUNCTION PANEL
ALK PHOS: 69 U/L (ref 39–117)
ALT: 17 U/L (ref 0–35)
AST: 25 U/L (ref 0–37)
Albumin: 4.3 g/dL (ref 3.5–5.2)
BILIRUBIN TOTAL: 0.7 mg/dL (ref 0.2–1.2)
Bilirubin, Direct: 0.1 mg/dL (ref 0.0–0.3)
Total Protein: 6.8 g/dL (ref 6.0–8.3)

## 2013-06-01 LAB — BASIC METABOLIC PANEL
BUN: 14 mg/dL (ref 6–23)
CHLORIDE: 103 meq/L (ref 96–112)
CO2: 28 mEq/L (ref 19–32)
Calcium: 9.6 mg/dL (ref 8.4–10.5)
Creatinine, Ser: 0.9 mg/dL (ref 0.4–1.2)
GFR: 66.16 mL/min (ref >60.00)
Glucose, Bld: 91 mg/dL (ref 70–99)
POTASSIUM: 4.3 meq/L (ref 3.5–5.1)
SODIUM: 139 meq/L (ref 135–145)

## 2013-06-01 LAB — LIPID PANEL
CHOLESTEROL: 196 mg/dL (ref 0–200)
HDL: 57.4 mg/dL (ref >39.00)
LDL CALC: 98 mg/dL (ref 0–99)
Total CHOL/HDL Ratio: 3
Triglycerides: 201 mg/dL — ABNORMAL HIGH (ref 0.0–149.0)
VLDL: 40.2 mg/dL — ABNORMAL HIGH (ref 0.0–40.0)

## 2013-06-01 LAB — TSH: TSH: 1.72 u[IU]/mL (ref 0.35–4.50)

## 2013-06-01 NOTE — Telephone Encounter (Signed)
What labs and dx?  

## 2013-06-01 NOTE — Telephone Encounter (Signed)
Order placed for labs.

## 2013-06-05 ENCOUNTER — Encounter: Payer: Self-pay | Admitting: Internal Medicine

## 2013-06-05 ENCOUNTER — Ambulatory Visit (INDEPENDENT_AMBULATORY_CARE_PROVIDER_SITE_OTHER): Payer: Commercial Managed Care - HMO | Admitting: Internal Medicine

## 2013-06-05 VITALS — BP 130/60 | HR 64 | Temp 98.3°F | Ht 65.5 in | Wt 148.0 lb

## 2013-06-05 DIAGNOSIS — K219 Gastro-esophageal reflux disease without esophagitis: Secondary | ICD-10-CM

## 2013-06-05 DIAGNOSIS — E7800 Pure hypercholesterolemia, unspecified: Secondary | ICD-10-CM

## 2013-06-05 DIAGNOSIS — I10 Essential (primary) hypertension: Secondary | ICD-10-CM

## 2013-06-05 DIAGNOSIS — K5730 Diverticulosis of large intestine without perforation or abscess without bleeding: Secondary | ICD-10-CM

## 2013-06-05 DIAGNOSIS — G459 Transient cerebral ischemic attack, unspecified: Secondary | ICD-10-CM

## 2013-06-05 DIAGNOSIS — R928 Other abnormal and inconclusive findings on diagnostic imaging of breast: Secondary | ICD-10-CM

## 2013-06-05 DIAGNOSIS — F439 Reaction to severe stress, unspecified: Secondary | ICD-10-CM

## 2013-06-05 DIAGNOSIS — R413 Other amnesia: Secondary | ICD-10-CM

## 2013-06-05 DIAGNOSIS — Z733 Stress, not elsewhere classified: Secondary | ICD-10-CM

## 2013-06-05 NOTE — Progress Notes (Signed)
Pre visit review using our clinic review tool, if applicable. No additional management support is needed unless otherwise documented below in the visit note. 

## 2013-06-11 ENCOUNTER — Encounter: Payer: Self-pay | Admitting: Internal Medicine

## 2013-06-11 NOTE — Assessment & Plan Note (Signed)
Previous TIA.  On aspirin and doing well.  Follow.

## 2013-06-11 NOTE — Assessment & Plan Note (Signed)
Recent labs ok.  Have discussed further w/up and treatment.  Will follow.  She has desired no further w/up.

## 2013-06-11 NOTE — Assessment & Plan Note (Signed)
Describes the increased fatigue as outlined.  Increased stress.  She feels a lot of this is related to her husbands stress and issues dealing with his son's death.  Desires no further intervention at this time.  Follow.

## 2013-06-11 NOTE — Assessment & Plan Note (Signed)
Symptoms controlled on Protonix and Zantac.  Follow. Continue follow up with Dr Skulskie.  Denies any nausea or vomiting.     

## 2013-06-11 NOTE — Assessment & Plan Note (Signed)
Blood pressure appears to be doing well.  Off hctz.   Follow.  Follow metabolic panel.    

## 2013-06-11 NOTE — Assessment & Plan Note (Signed)
Last mammogram 4/31/5 - Birads II.

## 2013-06-11 NOTE — Assessment & Plan Note (Signed)
Currently doing well.  Follow.  

## 2013-06-11 NOTE — Assessment & Plan Note (Signed)
Continue on Lovastatin.  Follow lipid panel and liver function.  06/01/13 lipid panel revealed LDL 98 and triglycerides of 210.  Follow.

## 2013-06-11 NOTE — Progress Notes (Signed)
Subjective:    Patient ID: Anita Santana, female    DOB: July 23, 1940, 73 y.o.   MRN: [id number]  HPI 73 year old female with past history of hypertension, hypercholesterolemia, GERD and previously presumed TIA.  She comes in today to follow up on these issues as well as for a complete physical exam.   She states she is doing relatively well.   No headache.  No lightheadedness or dizziness reported.   Memory - she feels is stable.  Desires no further intervention at this time.  No chest pain or tightness. Breathing stable.   Bowels stable.  No nausea or vomiting.  She does report some decreased energy.  Will intermittently have to take a nap through the day.  Not daily.  No pain, just general fatigue.  After discussion, some of this may be coming from increased stress and worry.  Her husband is still dealing with the death of his son.  This is something they are dealing with daily.  She reports that this puts a strain on her - seeing her husband go through this on a daily basis.  She denies being depressed.  Does not feel she needs any further intervention at this time.        Past Medical History  Diagnosis Date  . GERD (gastroesophageal reflux disease)   . Hypertension   . Hypercholesteremia   . TIA (transient ischemic attack)   . Syncope and collapse   . Diverticulosis of colon 02/08/12    Colonoscopy    Current Outpatient Prescriptions on File Prior to Visit  Medication Sig Dispense Refill  . acetaminophen (TYLENOL ARTHRITIS PAIN) 650 MG CR tablet Take 650 mg by mouth as needed.      Marland Kitchen aspirin 81 MG tablet Take 81 mg by mouth daily.      Marland Kitchen atorvastatin (LIPITOR) 10 MG tablet Take 1 tablet (10 mg total) by mouth daily.  90 tablet  1  . HYDROcodone-acetaminophen (NORCO/VICODIN) 5-325 MG per tablet Take 1 tablet by mouth every 6 (six) hours as needed.  30 tablet  0  . lisinopril (PRINIVIL,ZESTRIL) 40 MG tablet Take 1 tablet (40 mg total) by mouth daily.  90 tablet  1  . pantoprazole (PROTONIX)  40 MG tablet Take 1 tablet (40 mg total) by mouth daily.  90 tablet  1  . Psyllium (METAMUCIL PO) Take by mouth daily.      . ranitidine (ZANTAC) 150 MG tablet Take 150 mg by mouth as needed.      . simethicone (GAS-X) 80 MG chewable tablet Chew 80 mg by mouth as needed.       No current facility-administered medications on file prior to visit.    Review of Systems Patient denies any headache.  No light headedness or dizziness reported.  No sinus or allergy symptoms.  No chest pain, tightness or palpitations.  No increased shortness of breath, cough or congestion.  No vomiting.  No nausea.   No acid reflux.  No abdominal pain or cramping.  No bowel change, such as diarrhea, constipation, BRBPR or melana.  No urine change.  Fatigue as outlined.  Increased stress as outlined.       Objective:   Physical Exam  Filed Vitals:   06/05/13 0822  BP: 130/60  Pulse: 64  Temp: 98.3 F (36.8 C)   Blood pressure recheck:  66/67  73 year old female in no acute distress.   HEENT:  Nares- clear.  Oropharynx - without lesions. NECK:  Supple.  Nontender.  No audible bruit.  HEART:  Appears to be regular. LUNGS:  No crackles or wheezing audible.  Respirations even and unlabored.  RADIAL PULSE:  Equal bilaterally.    BREASTS:  No nipple discharge or nipple retraction present.  Could not appreciate any distinct nodules or axillary adenopathy.  ABDOMEN:  Soft, nontender.  Bowel sounds present and normal.  No audible abdominal bruit.  GU:  Not performed.   EXTREMITIES:  No increased edema present.  DP pulses palpable and equal bilaterally.          Assessment & Plan:  GI.  Evaluated by GI.  Had abdominal ultrasound.  Fatty liver.  No other acute abnormality.   CT revealed diverticulitis.  Weight stable.  Follow.    LIGHT HEADEDNESS.  Off HCTZ.  Pressure dong well.  Follow.  Light headedness has resolved.     HEALTH MAINTENANCE.  Physical today .  Colonoscopy 2013.  Mammogram 04/07/13 - Birads II.   Pap at last physical wnl.   I spent 25 minutes with the patient and more than 50% of the time was spent in consultation regarding the above.

## 2013-06-22 ENCOUNTER — Telehealth: Payer: Self-pay | Admitting: *Deleted

## 2013-06-22 NOTE — Telephone Encounter (Signed)
Spoke with pt advised of MDs message 

## 2013-06-22 NOTE — Telephone Encounter (Signed)
Please let her know that I am out of the office this week.  She will need to be evaluated to confirm etiology of pain.  Unable to call in antibiotic without her being seen.  If increased pain, recommend evaluation today.  [place] please send [place].  [facility] open until 7:00 today).  Let me know if any problems.    Dr Lorin PicketScott

## 2013-06-22 NOTE — Telephone Encounter (Signed)
Pt called states she is having lower left abd pain which started this morning.  Pt further states she has a history of Diverticulitis and the pain is usually in that area when she has a flare.  Pt is requesting an antibiotic to be ordered.  Please advise

## 2013-06-28 ENCOUNTER — Telehealth: Payer: Self-pay | Admitting: *Deleted

## 2013-06-28 ENCOUNTER — Observation Stay: Payer: Self-pay | Admitting: Internal Medicine

## 2013-06-28 LAB — BASIC METABOLIC PANEL
Anion Gap: 6 — ABNORMAL LOW (ref 7–16)
BUN: 10 mg/dL (ref 7–18)
Calcium, Total: 9.3 mg/dL (ref 8.5–10.1)
Chloride: 105 mmol/L (ref 98–107)
Co2: 27 mmol/L (ref 21–32)
Creatinine: 0.99 mg/dL (ref 0.60–1.30)
EGFR (African American): >60
GFR CALC NON AF AMER: 57 — AB
Glucose: 116 mg/dL — ABNORMAL HIGH (ref 65–99)
OSMOLALITY: 276 (ref 275–301)
Potassium: 3.6 mmol/L (ref 3.5–5.1)
SODIUM: 138 mmol/L (ref 136–145)

## 2013-06-28 LAB — CBC WITH DIFFERENTIAL/PLATELET
Basophil #: 0.1 10*3/uL (ref 0.0–0.1)
Basophil %: 1.3 %
EOS ABS: 0.3 10*3/uL (ref 0.0–0.7)
Eosinophil %: 4.8 %
HCT: 39.6 % (ref 35.0–47.0)
HGB: 13.2 g/dL (ref 12.0–16.0)
Lymphocyte #: 2.2 10*3/uL (ref 1.0–3.6)
Lymphocyte %: 32.3 %
MCH: 30 pg (ref 26.0–34.0)
MCHC: 33.3 g/dL (ref 32.0–36.0)
MCV: 90 fL (ref 80–100)
Monocyte #: 0.6 x10 3/mm (ref 0.2–0.9)
Monocyte %: 8.3 %
Neutrophil #: 3.7 10*3/uL (ref 1.4–6.5)
Neutrophil %: 53.3 %
Platelet: 287 10*3/uL (ref 150–440)
RBC: 4.39 10*6/uL (ref 3.80–5.20)
RDW: 13.3 % (ref 11.5–14.5)
WBC: 6.9 10*3/uL (ref 3.6–11.0)

## 2013-06-28 NOTE — Telephone Encounter (Signed)
Pt notified & states that she will go to the ER now.

## 2013-06-28 NOTE — Telephone Encounter (Signed)
Given the weakness, head feeling funny (dizzy) and slurring of speech, I don't think we can say this is coming from the antibiotics.  I would recommend her to be seen - with these issues.  Recommend to ER.  May need head scan.  Want to be sure no TIA.

## 2013-06-28 NOTE — Telephone Encounter (Signed)
Started Ciprofloxacin 500mg  & Flagyl 500mg  on 06/22/13 for Diverticulitis. [facility]. She is complaining of difficulty getting words out on Sunday, weakness, & head feels funny (dizzy). Wants to know if this is possibly a side effect of the medications. Please advise [facility]

## 2013-06-29 ENCOUNTER — Telehealth: Payer: Self-pay | Admitting: Internal Medicine

## 2013-06-29 DIAGNOSIS — I517 Cardiomegaly: Secondary | ICD-10-CM

## 2013-06-29 LAB — CBC WITH DIFFERENTIAL/PLATELET
Basophil #: 0.1 10*3/uL (ref 0.0–0.1)
Basophil %: 1.3 %
EOS ABS: 0.2 10*3/uL (ref 0.0–0.7)
Eosinophil %: 3.9 %
HCT: 36.3 % (ref 35.0–47.0)
HGB: 12.3 g/dL (ref 12.0–16.0)
LYMPHS ABS: 2.2 10*3/uL (ref 1.0–3.6)
Lymphocyte %: 36.3 %
MCH: 30.7 pg (ref 26.0–34.0)
MCHC: 34 g/dL (ref 32.0–36.0)
MCV: 90 fL (ref 80–100)
Monocyte #: 0.4 x10 3/mm (ref 0.2–0.9)
Monocyte %: 7.2 %
NEUTROS PCT: 51.3 %
Neutrophil #: 3.1 10*3/uL (ref 1.4–6.5)
Platelet: 266 10*3/uL (ref 150–440)
RBC: 4.02 10*6/uL (ref 3.80–5.20)
RDW: 13 % (ref 11.5–14.5)
WBC: 6.1 10*3/uL (ref 3.6–11.0)

## 2013-06-29 LAB — BASIC METABOLIC PANEL
Anion Gap: 6 — ABNORMAL LOW (ref 7–16)
BUN: 9 mg/dL (ref 7–18)
CO2: 28 mmol/L (ref 21–32)
Calcium, Total: 9.1 mg/dL (ref 8.5–10.1)
Chloride: 106 mmol/L (ref 98–107)
Creatinine: 0.94 mg/dL (ref 0.60–1.30)
Glucose: 95 mg/dL (ref 65–99)
Osmolality: 278 (ref 275–301)
Potassium: 3.6 mmol/L (ref 3.5–5.1)
Sodium: 140 mmol/L (ref 136–145)

## 2013-06-29 LAB — LIPID PANEL
Cholesterol: 133 mg/dL (ref 0–200)
HDL: 42 mg/dL (ref 40–60)
Ldl Cholesterol, Calc: 56 mg/dL (ref 0–100)
TRIGLYCERIDES: 177 mg/dL (ref 0–200)
VLDL CHOLESTEROL, CALC: 35 mg/dL (ref 5–40)

## 2013-06-29 LAB — HEMOGLOBIN A1C: Hemoglobin A1C: 6 % (ref 4.2–6.3)

## 2013-06-29 LAB — TSH: Thyroid Stimulating Horm: 1.37 u[IU]/mL

## 2013-06-29 NOTE — Telephone Encounter (Signed)
Spoke with patient today & scheduled a ER follow-up. Pt states that she is doing okay today. Test did not show any evidence of a TIA.

## 2013-06-29 NOTE — Telephone Encounter (Signed)
The patient has been scheduled

## 2013-06-29 NOTE — Telephone Encounter (Signed)
Please advise on date/time. ER records requested

## 2013-06-29 NOTE — Telephone Encounter (Signed)
The patient is needing a hospital follow up for TIA's .

## 2013-06-29 NOTE — Telephone Encounter (Signed)
I can see her at 11:45 on 07/06/13 if no one else is scheduled to be put in that slot.  I thought I sent you or Melissa a note to put someone in.  If no notes to schedule someone, put her here for ER f/u.  Confirm pt doing ok.

## 2013-06-29 NOTE — Telephone Encounter (Signed)
Pt states that she is doing better, no signs of stroke found on tests ran. Pt has been scheduled for 07/06/13 @ 11:45. (Records received)

## 2013-06-29 NOTE — Telephone Encounter (Signed)
Please place patient on schedule for a hospital f/u on 07/06/13 @ 11:45 (pt notified of appt already)

## 2013-07-04 ENCOUNTER — Other Ambulatory Visit: Payer: Self-pay | Admitting: Internal Medicine

## 2013-07-06 ENCOUNTER — Ambulatory Visit (INDEPENDENT_AMBULATORY_CARE_PROVIDER_SITE_OTHER): Payer: Commercial Managed Care - HMO | Admitting: Internal Medicine

## 2013-07-06 ENCOUNTER — Encounter: Payer: Self-pay | Admitting: Internal Medicine

## 2013-07-06 VITALS — BP 130/70 | HR 75 | Temp 98.3°F | Ht 65.5 in | Wt 146.0 lb

## 2013-07-06 DIAGNOSIS — R413 Other amnesia: Secondary | ICD-10-CM

## 2013-07-06 DIAGNOSIS — G459 Transient cerebral ischemic attack, unspecified: Secondary | ICD-10-CM

## 2013-07-06 DIAGNOSIS — F439 Reaction to severe stress, unspecified: Secondary | ICD-10-CM

## 2013-07-06 DIAGNOSIS — K219 Gastro-esophageal reflux disease without esophagitis: Secondary | ICD-10-CM

## 2013-07-06 DIAGNOSIS — E7800 Pure hypercholesterolemia, unspecified: Secondary | ICD-10-CM

## 2013-07-06 DIAGNOSIS — I10 Essential (primary) hypertension: Secondary | ICD-10-CM

## 2013-07-06 DIAGNOSIS — Z733 Stress, not elsewhere classified: Secondary | ICD-10-CM

## 2013-07-06 NOTE — Progress Notes (Signed)
Pre visit review using our clinic review tool, if applicable. No additional management support is needed unless otherwise documented below in the visit note. 

## 2013-07-09 ENCOUNTER — Encounter: Payer: Self-pay | Admitting: Internal Medicine

## 2013-07-09 NOTE — Assessment & Plan Note (Signed)
Symptoms controlled on Protonix and Zantac.  Follow. Continue follow up with Dr Marva PandaSkulskie.  Denies any nausea or vomiting.

## 2013-07-09 NOTE — Assessment & Plan Note (Signed)
Recently admitted with TIA.  Will increase her aspirin to 325mg  q day.  Had MRI, ECHO and carotid ultrasound while in the hospital.  No reoccurrence of symptoms.  Will refer to neurology.

## 2013-07-09 NOTE — Assessment & Plan Note (Addendum)
Continue on low dose lipitor.  Follow lipid panel and liver function.  06/01/13 lipid panel revealed LDL 98 and triglycerides of 210.  Follow.

## 2013-07-09 NOTE — Assessment & Plan Note (Signed)
Describes the increased fatigue as outlined.  Increased stress.  She feels a lot of this is related to her husbands stress and issues dealing with his son's death.  Has desired no further intervention.  See last note.

## 2013-07-09 NOTE — Assessment & Plan Note (Signed)
Some concern prior to TIA.  Recent labs unrevealing.  Recent MRI with no acute stroke.  Mild small vessel disease changes.  Refer to neurology for further evaluation.  Have discussed the increased stress as outlined in last note.

## 2013-07-09 NOTE — Assessment & Plan Note (Signed)
Blood pressure appears to be doing well.  Off hctz.   Follow.  Follow metabolic panel.

## 2013-07-09 NOTE — Progress Notes (Signed)
Subjective:    Patient ID: Anita Santana, female    DOB: Dec 24, 1940, 73 y.o.   MRN: [id number]  HPI 73 year old female with past history of hypertension, hypercholesterolemia, GERD and previously presumed TIA.  She comes in today as a work in for hospital follow up.  Was admitted 06/28/13 with TIA.  Her husband accompanies her.  History obtained from both of them.  Husband reports that on Father's Day, he noticed her slurring her speech.  Some increased confusion.  The slurring appeared to have lasted approximately three hours.  Went to ER on 06/28/13.  Admitted.  Worked up.  MRI revealed no acute stroke.  Had echo and carotid ultrasound.  Was observed and then discharged on 81mg  aspirin.  She was on aspirin prior to her TIA.  Had not skipped any doses.   No headache.  No lightheadedness or dizziness reported.   No chest pain or tightness. Breathing stable.   Bowels stable.  No nausea or vomiting.  She does report some decreased energy.  Husband reports some concerns regarding her memory - prior to this event.  States will forget her conversation with her daughter (on the phone).  Sometimes repeats the same questions.        Past Medical History  Diagnosis Date  . GERD (gastroesophageal reflux disease)   . Hypertension   . Hypercholesteremia   . TIA (transient ischemic attack)   . Syncope and collapse   . Diverticulosis of colon 02/08/12    Colonoscopy    Current Outpatient Prescriptions on File Prior to Visit  Medication Sig Dispense Refill  . acetaminophen (TYLENOL ARTHRITIS PAIN) 650 MG CR tablet Take 650 mg by mouth as needed.      Marland Kitchen. aspirin 81 MG tablet Take 81 mg by mouth daily.      Marland Kitchen. atorvastatin (LIPITOR) 10 MG tablet Take 1 tablet (10 mg total) by mouth daily.  90 tablet  1  . HYDROcodone-acetaminophen (NORCO/VICODIN) 5-325 MG per tablet Take 1 tablet by mouth every 6 (six) hours as needed.  30 tablet  0  . lisinopril (PRINIVIL,ZESTRIL) 40 MG tablet Take 1 tablet (40 mg total) by  mouth daily.  90 tablet  1  . pantoprazole (PROTONIX) 40 MG tablet Take 1 tablet (40 mg total) by mouth daily.  90 tablet  1  . Psyllium (METAMUCIL PO) Take by mouth daily.      . ranitidine (ZANTAC) 150 MG tablet Take 150 mg by mouth as needed.      . simethicone (GAS-X) 80 MG chewable tablet Chew 80 mg by mouth as needed.       No current facility-administered medications on file prior to visit.    Review of Systems Patient denies any headache.  No light headedness or dizziness reported.  No sinus or allergy symptoms.  No chest pain, tightness or palpitations.  No increased shortness of breath, cough or congestion.  No vomiting.  No nausea.   No acid reflux.  No abdominal pain or cramping.  No bowel change, such as diarrhea, constipation, BRBPR or melana.  No urine change.  Fatigue as outlined.  Increased stress.  See last note.       Objective:   Physical Exam  Filed Vitals:   07/06/13 1131  BP: 130/70  Pulse: 75  Temp: 98.3 F (5436.728 C)   73 year old female in no acute distress.   HEENT:  Nares- clear.  Oropharynx - without lesions. NECK:  Supple.  Nontender.  No audible bruit.  HEART:  Appears to be regular. LUNGS:  No crackles or wheezing audible.  Respirations even and unlabored.  RADIAL PULSE:  Equal bilaterally.  ABDOMEN:  Soft, nontender.  Bowel sounds present and normal.  No audible abdominal bruit.  EXTREMITIES:  No increased edema present.  DP pulses palpable and equal bilaterally.    NEURO:  Cranial nerves appear to be intact.  Able to spell WORLD backwards and subtract serial 7s.  Oriented to person, place and time (date).  Knew the president.  Could recall 1/3 objects.         Assessment & Plan:  GI.  Evaluated by GI.  Had abdominal ultrasound.  Fatty liver.  No other acute abnormality.   CT revealed diverticulitis.  Weight stable.  Follow.    LIGHT HEADEDNESS.  Off HCTZ.  Pressure dong well.  Follow.  Light headedness has resolved.     HEALTH MAINTENANCE.   Physical last visit .  Colonoscopy 2013.  Mammogram 04/07/13 - Birads II.  Pap at last physical wnl.   I spent 40 minutes with the patient and more than 50% of the time was spent in consultation regarding the above, specifically discussed her symptoms prior to her hospitalization, her w/up and symptoms after discharge and further w/up.

## 2013-09-06 ENCOUNTER — Ambulatory Visit (INDEPENDENT_AMBULATORY_CARE_PROVIDER_SITE_OTHER): Payer: Commercial Managed Care - HMO | Admitting: *Deleted

## 2013-09-06 DIAGNOSIS — Z23 Encounter for immunization: Secondary | ICD-10-CM

## 2013-10-03 ENCOUNTER — Other Ambulatory Visit (INDEPENDENT_AMBULATORY_CARE_PROVIDER_SITE_OTHER): Payer: Commercial Managed Care - HMO

## 2013-10-03 DIAGNOSIS — E7800 Pure hypercholesterolemia, unspecified: Secondary | ICD-10-CM

## 2013-10-03 DIAGNOSIS — G459 Transient cerebral ischemic attack, unspecified: Secondary | ICD-10-CM

## 2013-10-03 DIAGNOSIS — I10 Essential (primary) hypertension: Secondary | ICD-10-CM

## 2013-10-03 LAB — LIPID PANEL
CHOLESTEROL: 199 mg/dL (ref 0–200)
HDL: 48.5 mg/dL (ref >39.00)
LDL Cholesterol: 115 mg/dL — ABNORMAL HIGH (ref 0–99)
NonHDL: 150.5
Total CHOL/HDL Ratio: 4
Triglycerides: 179 mg/dL — ABNORMAL HIGH (ref 0.0–149.0)
VLDL: 35.8 mg/dL (ref 0.0–40.0)

## 2013-10-03 LAB — CBC WITH DIFFERENTIAL/PLATELET
Basophils Absolute: 0 10*3/uL (ref 0.0–0.1)
Basophils Relative: 0.4 % (ref 0.0–3.0)
Eosinophils Absolute: 0.3 10*3/uL (ref 0.0–0.7)
Eosinophils Relative: 4.4 % (ref 0.0–5.0)
HEMATOCRIT: 37.7 % (ref 36.0–46.0)
Hemoglobin: 12.8 g/dL (ref 12.0–15.0)
LYMPHS ABS: 2 10*3/uL (ref 0.7–4.0)
LYMPHS PCT: 34.6 % (ref 12.0–46.0)
MCHC: 34 g/dL (ref 30.0–36.0)
MCV: 89.1 fl (ref 78.0–100.0)
MONOS PCT: 6.5 % (ref 3.0–12.0)
Monocytes Absolute: 0.4 10*3/uL (ref 0.1–1.0)
Neutro Abs: 3.1 10*3/uL (ref 1.4–7.7)
Neutrophils Relative %: 54.1 % (ref 43.0–77.0)
PLATELETS: 219 10*3/uL (ref 150.0–400.0)
RBC: 4.23 Mil/uL (ref 3.87–5.11)
RDW: 13.8 % (ref 11.5–15.5)
WBC: 5.7 10*3/uL (ref 4.0–10.5)

## 2013-10-03 LAB — HEPATIC FUNCTION PANEL
ALT: 20 U/L (ref 0–35)
AST: 24 U/L (ref 0–37)
Albumin: 4.2 g/dL (ref 3.5–5.2)
Alkaline Phosphatase: 72 U/L (ref 39–117)
Bilirubin, Direct: 0.1 mg/dL (ref 0.0–0.3)
Total Bilirubin: 0.6 mg/dL (ref 0.2–1.2)
Total Protein: 7 g/dL (ref 6.0–8.3)

## 2013-10-03 LAB — BASIC METABOLIC PANEL
BUN: 13 mg/dL (ref 6–23)
CO2: 29 mEq/L (ref 19–32)
Calcium: 9.2 mg/dL (ref 8.4–10.5)
Chloride: 103 mEq/L (ref 96–112)
Creatinine, Ser: 0.9 mg/dL (ref 0.4–1.2)
GFR: 66.09 mL/min (ref >60.00)
Glucose, Bld: 101 mg/dL — ABNORMAL HIGH (ref 70–99)
Potassium: 4.4 mEq/L (ref 3.5–5.1)
SODIUM: 137 meq/L (ref 135–145)

## 2013-10-05 ENCOUNTER — Encounter: Payer: Self-pay | Admitting: Internal Medicine

## 2013-10-05 ENCOUNTER — Ambulatory Visit (INDEPENDENT_AMBULATORY_CARE_PROVIDER_SITE_OTHER): Payer: Commercial Managed Care - HMO | Admitting: Internal Medicine

## 2013-10-05 VITALS — BP 120/70 | HR 65 | Temp 98.8°F | Ht 65.5 in | Wt 148.0 lb

## 2013-10-05 DIAGNOSIS — E7800 Pure hypercholesterolemia, unspecified: Secondary | ICD-10-CM

## 2013-10-05 DIAGNOSIS — K5730 Diverticulosis of large intestine without perforation or abscess without bleeding: Secondary | ICD-10-CM

## 2013-10-05 DIAGNOSIS — F439 Reaction to severe stress, unspecified: Secondary | ICD-10-CM

## 2013-10-05 DIAGNOSIS — K219 Gastro-esophageal reflux disease without esophagitis: Secondary | ICD-10-CM

## 2013-10-05 DIAGNOSIS — R413 Other amnesia: Secondary | ICD-10-CM

## 2013-10-05 DIAGNOSIS — Z658 Other specified problems related to psychosocial circumstances: Secondary | ICD-10-CM

## 2013-10-05 DIAGNOSIS — R928 Other abnormal and inconclusive findings on diagnostic imaging of breast: Secondary | ICD-10-CM

## 2013-10-05 DIAGNOSIS — I10 Essential (primary) hypertension: Secondary | ICD-10-CM

## 2013-10-05 MED ORDER — ATORVASTATIN CALCIUM 20 MG PO TABS: 20.0000 mg | ORAL_TABLET | Freq: Every day | ORAL | Status: DC

## 2013-10-05 NOTE — Patient Instructions (Signed)
Change lipitor to 20mg  per day.  (can take 2 (10mg  tablets) per day until 20mg  tablet mail order comes in.

## 2013-10-05 NOTE — Assessment & Plan Note (Addendum)
On lipitor.   Follow lipid panel and liver function.  LDL just checked 115.  Goal < 100.  Increase lipitor to 20mg  q day.  Triglycerides are decreasing.

## 2013-10-05 NOTE — Progress Notes (Signed)
Subjective:    Patient ID: Anita Santana, female    DOB: 09/12/1940, 73 y.o.   MRN: [id number]  HPI 73 year old female with past history of hypertension, hypercholesterolemia, GERD and previously presumed TIA.  She comes in today for a scheduled follow up.  Was admitted 06/28/13 with TIA.  On 325mg  aspirin now.  Doing well.  Has had no reoccurring episodes.  Feels she is handling stress relatively well.  Does not need any further intervention.  Breathing stable.  No chest pain or tightness.  No sob.  No increased heart rate or palpitations.  Eating and drinking well.  No nausea or vomiting.  Weight stable (up a couple of pounds from last check).       Past Medical History  Diagnosis Date  . GERD (gastroesophageal reflux disease)   . Hypertension   . Hypercholesteremia   . TIA (transient ischemic attack)   . Syncope and collapse   . Diverticulosis of colon 02/08/12    Colonoscopy    Current Outpatient Prescriptions on File Prior to Visit  Medication Sig Dispense Refill  . acetaminophen (TYLENOL ARTHRITIS PAIN) 650 MG CR tablet Take 650 mg by mouth as needed.      Marland Kitchen aspirin 81 MG tablet Take 81 mg by mouth daily.      Marland Kitchen atorvastatin (LIPITOR) 10 MG tablet Take 1 tablet (10 mg total) by mouth daily.  90 tablet  1  . HYDROcodone-acetaminophen (NORCO/VICODIN) 5-325 MG per tablet Take 1 tablet by mouth every 6 (six) hours as needed.  30 tablet  0  . lisinopril (PRINIVIL,ZESTRIL) 40 MG tablet Take 1 tablet (40 mg total) by mouth daily.  90 tablet  1  . pantoprazole (PROTONIX) 40 MG tablet Take 1 tablet (40 mg total) by mouth daily.  90 tablet  1  . Psyllium (METAMUCIL PO) Take by mouth daily.      . ranitidine (ZANTAC) 150 MG tablet Take 150 mg by mouth as needed.      . simethicone (GAS-X) 80 MG chewable tablet Chew 80 mg by mouth as needed.       No current facility-administered medications on file prior to visit.    Review of Systems Patient denies any headache.  No light headedness or  dizziness reported.  No sinus or allergy symptoms.  No chest pain, tightness or palpitations.  No increased shortness of breath, cough or congestion.  No vomiting.  No nausea.   No acid reflux.  No abdominal pain or cramping.  No bowel change, such as diarrhea, constipation, BRBPR or melana.  No urine change.  Increased stress.  See last note.  Feels she is handling things relatively well.      Objective:   Physical Exam  Filed Vitals:   10/05/13 0808  BP: 120/70  Pulse: 65  Temp: 98.8 F (37.1 C)   Blood pressure recheck:  11/58  73 year old female in no acute distress.   HEENT:  Nares- clear.  Oropharynx - without lesions. NECK:  Supple.  Nontender.  No audible bruit.  HEART:  Appears to be regular. LUNGS:  No crackles or wheezing audible.  Respirations even and unlabored.  RADIAL PULSE:  Equal bilaterally.  ABDOMEN:  Soft, nontender.  Bowel sounds present and normal.  No audible abdominal bruit.  EXTREMITIES:  No increased edema present.  DP pulses palpable and equal bilaterally.        Assessment & Plan:  GI.  Evaluated by GI.  Had abdominal  ultrasound.  Fatty liver.  No other acute abnormality.   CT revealed diverticulitis.  Weight stable.  Follow.    LIGHT HEADEDNESS.  Off HCTZ.  Pressure dong well.  Follow.  Light headedness has resolved.     HEALTH MAINTENANCE.  Physical 06/05/13.  Colonoscopy 2013.  Mammogram 04/07/13 - Birads II.  Pap at last physical wnl.   I spent 25 minutes with the patient and more than 50% of the time was spent in consultation regarding the above.

## 2013-10-05 NOTE — Progress Notes (Signed)
Pre visit review using our clinic review tool, if applicable. No additional management support is needed unless otherwise documented below in the visit note. 

## 2013-10-08 ENCOUNTER — Encounter: Payer: Self-pay | Admitting: Internal Medicine

## 2013-10-08 NOTE — Assessment & Plan Note (Signed)
Last mammogram 4/31/5 - Birads II.     

## 2013-10-08 NOTE — Assessment & Plan Note (Signed)
Increased stress.  She feels a lot of this is related to her husbands stress and issues dealing with his son's death.  Has desired no further intervention.  Feels she is coping relatively well.  Follow.

## 2013-10-08 NOTE — Assessment & Plan Note (Signed)
Blood pressure appears to be doing well.  Off hctz.   Follow.  Follow metabolic panel.    

## 2013-10-08 NOTE — Assessment & Plan Note (Signed)
Symptoms controlled on Protonix and Zantac.  Follow. Continue follow up with Dr Skulskie.  Denies any nausea or vomiting.     

## 2013-10-08 NOTE — Assessment & Plan Note (Signed)
Currently doing well.  Follow.  

## 2013-10-08 NOTE — Assessment & Plan Note (Addendum)
Some concern prior to TIA.  Recent labs unrevealing.  Recent MRI with no acute stroke.  Mild small vessel disease changes.  Referred to neurology for further evaluation.  Have discussed the increased stress.  She feels she is coping relatively well.  Follow.  Check B12 and folate.

## 2013-10-09 ENCOUNTER — Other Ambulatory Visit: Payer: Self-pay | Admitting: Internal Medicine

## 2013-10-09 DIAGNOSIS — R413 Other amnesia: Secondary | ICD-10-CM

## 2013-10-09 NOTE — Progress Notes (Signed)
Orders placed for labs

## 2013-10-12 ENCOUNTER — Other Ambulatory Visit (INDEPENDENT_AMBULATORY_CARE_PROVIDER_SITE_OTHER): Payer: Commercial Managed Care - HMO

## 2013-10-12 DIAGNOSIS — I10 Essential (primary) hypertension: Secondary | ICD-10-CM

## 2013-10-12 DIAGNOSIS — R413 Other amnesia: Secondary | ICD-10-CM

## 2013-10-12 DIAGNOSIS — E7800 Pure hypercholesterolemia, unspecified: Secondary | ICD-10-CM

## 2013-10-12 LAB — LIPID PANEL
Cholesterol: 180 mg/dL (ref 0–200)
HDL: 50.2 mg/dL (ref >39.00)
LDL CALC: 99 mg/dL (ref 0–99)
NONHDL: 129.8
TRIGLYCERIDES: 153 mg/dL — AB (ref 0.0–149.0)
Total CHOL/HDL Ratio: 4
VLDL: 30.6 mg/dL (ref 0.0–40.0)

## 2013-10-12 LAB — BASIC METABOLIC PANEL
BUN: 17 mg/dL (ref 6–23)
CO2: 29 mEq/L (ref 19–32)
Calcium: 9.5 mg/dL (ref 8.4–10.5)
Chloride: 102 mEq/L (ref 96–112)
Creatinine, Ser: 0.9 mg/dL (ref 0.4–1.2)
GFR: 66.09 mL/min (ref >60.00)
GLUCOSE: 128 mg/dL — AB (ref 70–99)
Potassium: 4.3 mEq/L (ref 3.5–5.1)
Sodium: 139 mEq/L (ref 135–145)

## 2013-10-12 LAB — HEPATIC FUNCTION PANEL
ALBUMIN: 3.8 g/dL (ref 3.5–5.2)
ALT: 21 U/L (ref 0–35)
AST: 25 U/L (ref 0–37)
Alkaline Phosphatase: 80 U/L (ref 39–117)
Bilirubin, Direct: 0.1 mg/dL (ref 0.0–0.3)
Total Bilirubin: 0.8 mg/dL (ref 0.2–1.2)
Total Protein: 7.2 g/dL (ref 6.0–8.3)

## 2013-10-12 LAB — VITAMIN B12: Vitamin B-12: 486 pg/mL (ref 211–911)

## 2013-10-13 LAB — FOLATE RBC: RBC FOLATE: 786 ng/mL (ref >280)

## 2013-10-14 LAB — METHYLMALONIC ACID, SERUM: Methylmalonic Acid, Quant: 134 nmol/L (ref 87–318)

## 2013-10-15 ENCOUNTER — Other Ambulatory Visit: Payer: Self-pay | Admitting: Internal Medicine

## 2013-10-15 DIAGNOSIS — R739 Hyperglycemia, unspecified: Secondary | ICD-10-CM

## 2013-10-15 NOTE — Progress Notes (Signed)
Order placed for f/u labs.  

## 2013-10-16 ENCOUNTER — Encounter: Payer: Self-pay | Admitting: *Deleted

## 2013-10-18 ENCOUNTER — Other Ambulatory Visit: Payer: Commercial Managed Care - HMO

## 2013-10-19 ENCOUNTER — Other Ambulatory Visit: Payer: Self-pay | Admitting: Internal Medicine

## 2013-11-14 ENCOUNTER — Other Ambulatory Visit (INDEPENDENT_AMBULATORY_CARE_PROVIDER_SITE_OTHER): Payer: Commercial Managed Care - HMO

## 2013-11-14 DIAGNOSIS — R739 Hyperglycemia, unspecified: Secondary | ICD-10-CM

## 2013-11-14 LAB — HEMOGLOBIN A1C: Hgb A1c MFr Bld: 5.8 % (ref 4.6–6.5)

## 2013-11-15 LAB — GLUCOSE, FASTING: GLUCOSE, FASTING: 83 mg/dL (ref 70–99)

## 2014-01-30 DIAGNOSIS — F0280 Dementia in other diseases classified elsewhere without behavioral disturbance: Secondary | ICD-10-CM | POA: Diagnosis not present

## 2014-01-30 DIAGNOSIS — G301 Alzheimer's disease with late onset: Secondary | ICD-10-CM | POA: Diagnosis not present

## 2014-01-31 ENCOUNTER — Telehealth: Payer: Self-pay | Admitting: *Deleted

## 2014-01-31 DIAGNOSIS — I10 Essential (primary) hypertension: Secondary | ICD-10-CM

## 2014-01-31 DIAGNOSIS — R739 Hyperglycemia, unspecified: Secondary | ICD-10-CM

## 2014-01-31 DIAGNOSIS — E7800 Pure hypercholesterolemia, unspecified: Secondary | ICD-10-CM

## 2014-01-31 DIAGNOSIS — G459 Transient cerebral ischemic attack, unspecified: Secondary | ICD-10-CM

## 2014-01-31 NOTE — Telephone Encounter (Signed)
Pt coming in tomorrow what labs and dx?  

## 2014-02-01 ENCOUNTER — Other Ambulatory Visit (INDEPENDENT_AMBULATORY_CARE_PROVIDER_SITE_OTHER): Payer: Commercial Managed Care - HMO

## 2014-02-01 ENCOUNTER — Other Ambulatory Visit: Payer: Self-pay | Admitting: Internal Medicine

## 2014-02-01 DIAGNOSIS — E7800 Pure hypercholesterolemia, unspecified: Secondary | ICD-10-CM

## 2014-02-01 DIAGNOSIS — R739 Hyperglycemia, unspecified: Secondary | ICD-10-CM

## 2014-02-01 DIAGNOSIS — I10 Essential (primary) hypertension: Secondary | ICD-10-CM | POA: Diagnosis not present

## 2014-02-01 LAB — HEPATIC FUNCTION PANEL
ALBUMIN: 4.4 g/dL (ref 3.5–5.2)
ALT: 14 U/L (ref 0–35)
AST: 19 U/L (ref 0–37)
Alkaline Phosphatase: 85 U/L (ref 39–117)
Bilirubin, Direct: 0.1 mg/dL (ref 0.0–0.3)
Total Bilirubin: 0.8 mg/dL (ref 0.2–1.2)
Total Protein: 6.8 g/dL (ref 6.0–8.3)

## 2014-02-01 LAB — LIPID PANEL
CHOL/HDL RATIO: 3
CHOLESTEROL: 187 mg/dL (ref 0–200)
HDL: 57.3 mg/dL (ref >39.00)
LDL Cholesterol: 100 mg/dL — ABNORMAL HIGH (ref 0–99)
NonHDL: 129.7
Triglycerides: 147 mg/dL (ref 0.0–149.0)
VLDL: 29.4 mg/dL (ref 0.0–40.0)

## 2014-02-01 LAB — BASIC METABOLIC PANEL
BUN: 17 mg/dL (ref 6–23)
CALCIUM: 9.8 mg/dL (ref 8.4–10.5)
CHLORIDE: 106 meq/L (ref 96–112)
CO2: 26 mEq/L (ref 19–32)
CREATININE: 0.86 mg/dL (ref 0.40–1.20)
GFR: 68.7 mL/min (ref >60.00)
Glucose, Bld: 94 mg/dL (ref 70–99)
POTASSIUM: 4.5 meq/L (ref 3.5–5.1)
SODIUM: 145 meq/L (ref 135–145)

## 2014-02-01 LAB — HEMOGLOBIN A1C: HEMOGLOBIN A1C: 6 % (ref 4.6–6.5)

## 2014-02-01 NOTE — Telephone Encounter (Signed)
Orders placed for labs

## 2014-02-05 ENCOUNTER — Encounter: Payer: Self-pay | Admitting: Internal Medicine

## 2014-02-05 ENCOUNTER — Ambulatory Visit (INDEPENDENT_AMBULATORY_CARE_PROVIDER_SITE_OTHER): Payer: Commercial Managed Care - HMO | Admitting: Internal Medicine

## 2014-02-05 VITALS — BP 120/60 | HR 65 | Temp 98.5°F | Ht 65.5 in | Wt 143.5 lb

## 2014-02-05 DIAGNOSIS — I10 Essential (primary) hypertension: Secondary | ICD-10-CM

## 2014-02-05 DIAGNOSIS — K5730 Diverticulosis of large intestine without perforation or abscess without bleeding: Secondary | ICD-10-CM

## 2014-02-05 DIAGNOSIS — Z0000 Encounter for general adult medical examination without abnormal findings: Secondary | ICD-10-CM

## 2014-02-05 DIAGNOSIS — G459 Transient cerebral ischemic attack, unspecified: Secondary | ICD-10-CM

## 2014-02-05 DIAGNOSIS — E7800 Pure hypercholesterolemia, unspecified: Secondary | ICD-10-CM

## 2014-02-05 DIAGNOSIS — K219 Gastro-esophageal reflux disease without esophagitis: Secondary | ICD-10-CM | POA: Diagnosis not present

## 2014-02-05 DIAGNOSIS — R413 Other amnesia: Secondary | ICD-10-CM | POA: Diagnosis not present

## 2014-02-05 DIAGNOSIS — Z23 Encounter for immunization: Secondary | ICD-10-CM

## 2014-02-05 DIAGNOSIS — R739 Hyperglycemia, unspecified: Secondary | ICD-10-CM

## 2014-02-05 NOTE — Progress Notes (Signed)
Pre visit review using our clinic review tool, if applicable. No additional management support is needed unless otherwise documented below in the visit note. 

## 2014-02-05 NOTE — Progress Notes (Signed)
Patient ID: Anita Santana, female   DOB: [birth year], 74 y.o.   MRN: [id number]   Subjective:    Patient ID: Anita Santana, female    DOB: 03-May-1940, 74 y.o.   MRN: [id number]  HPI  Patient here for a scheduled follow.  Has a history of hypertension, memory change and hypercholesterolemia.  Accompanied by her husband.  History obtained from both of them.  She reports she is doing well.  No pain.  Feels good.  Husband reports persistent problems with her memory.  She sees Dr Sherryll Burger.  Was started on aricept.  Felt it did not help and she reports not sleeping as well on the medication.  She stopped it on her own.  He started her on razadyne.  Has not started taking.  Waiting on mail order.  Discussed taking the medication regularly.  States she is eating and drinking well.     Past Medical History  Diagnosis Date  . GERD (gastroesophageal reflux disease)   . Hypertension   . Hypercholesteremia   . TIA (transient ischemic attack)   . Syncope and collapse   . Diverticulosis of colon 02/08/12    Colonoscopy    Current Outpatient Prescriptions on File Prior to Visit  Medication Sig Dispense Refill  . acetaminophen (TYLENOL ARTHRITIS PAIN) 650 MG CR tablet Take 650 mg by mouth as needed.    Marland Kitchen aspirin 81 MG tablet Take 81 mg by mouth daily.    Marland Kitchen atorvastatin (LIPITOR) 20 MG tablet Take 1 tablet (20 mg total) by mouth daily. 90 tablet 1  . hydrochlorothiazide (HYDRODIURIL) 25 MG tablet TAKE 1/2 TABLET EVERY DAY 45 tablet 1  . lisinopril (PRINIVIL,ZESTRIL) 40 MG tablet TAKE 1 TABLET EVERY DAY 90 tablet 1  . pantoprazole (PROTONIX) 40 MG tablet TAKE 1 TABLET EVERY DAY 90 tablet 1  . Psyllium (METAMUCIL PO) Take by mouth daily.    . ranitidine (ZANTAC) 150 MG tablet Take 150 mg by mouth as needed.    . simethicone (GAS-X) 80 MG chewable tablet Chew 80 mg by mouth as needed.     No current facility-administered medications on file prior to visit.    Review of Systems  Constitutional: Negative  for appetite change, fatigue and unexpected weight change.  HENT: Negative for congestion and sinus pressure.   Respiratory: Negative for cough, chest tightness and shortness of breath.   Cardiovascular: Negative for chest pain, palpitations and leg swelling.  Gastrointestinal: Negative for nausea, abdominal pain, diarrhea and constipation.  Musculoskeletal: Negative for back pain and joint swelling.  Skin: Negative for color change and rash.  Neurological: Negative for dizziness, light-headedness and headaches.  Psychiatric/Behavioral: Positive for confusion (memory change as outlined.  repeats herself a lot.  ask questions over and over (per husband).  ). Negative for agitation. The patient is not nervous/anxious.        Objective:     Blood pressure recheck:  136/79  Physical Exam  Constitutional: No distress.  HENT:  Nose: Nose normal.  Mouth/Throat: Oropharynx is clear and moist.  Neck: Neck supple. No thyromegaly present.  Cardiovascular: Normal rate and regular rhythm.   Pulmonary/Chest: Breath sounds normal. No respiratory distress. She has no wheezes.  Abdominal: Soft. Bowel sounds are normal. There is no tenderness.  Musculoskeletal: She exhibits no edema or tenderness.  Lymphadenopathy:    She has no cervical adenopathy.  Skin: No rash noted. No erythema.  Psychiatric: She has a normal mood and affect. Her behavior is normal.  BP 120/60 mmHg  Pulse 65  Temp(Src) 98.5 F (36.9 C) (Oral)  Ht 5' 5.5" (1.664 m)  Wt 143 lb 8 oz (65.091 kg)  BMI 23.51 kg/m2  SpO2 97% Wt Readings from Last 3 Encounters:  02/05/14 143 lb 8 oz (65.091 kg)  10/05/13 148 lb (67.132 kg)  07/06/13 146 lb (66.225 kg)     Lab Results  Component Value Date   WBC 5.7 10/03/2013   HGB 12.8 10/03/2013   HCT 37.7 10/03/2013   PLT 219.0 10/03/2013   GLUCOSE 94 02/01/2014   CHOL 187 02/01/2014   TRIG 147.0 02/01/2014   HDL 57.30 02/01/2014   LDLDIRECT 237.0 07/15/2012   LDLCALC 100*  02/01/2014   ALT 14 02/01/2014   AST 19 02/01/2014   NA 145 02/01/2014   K 4.5 02/01/2014   CL 106 02/01/2014   CREATININE 0.86 02/01/2014   BUN 17 02/01/2014   CO2 26 02/01/2014   TSH 1.72 06/01/2013   HGBA1C 6.0 02/01/2014       Assessment & Plan:   Problem List Items Addressed This Visit    Diverticulosis of colon without hemorrhage    Colonoscopy 02/08/12.  Bowels stable.        GERD (gastroesophageal reflux disease)    Symptoms controlled.  On protonix.        Health care maintenance    Schedule her for a physical next appt.        Hypercholesteremia    Low cholesterol diet and exercise.  On lipitor 20mg  q day.  Tolerating.  LDL just checked - 100.        Relevant Orders   Lipid panel   Hepatic function panel   Hyperglycemia    Low carb diet and exercise.  Follow.       Relevant Orders   Hemoglobin A1c   Hypertension    Blood pressure doing well.  Same medication regimen.  Follow.        Relevant Orders   Basic metabolic panel   Memory change    Seeing Dr Sherryll BurgerShah.  Did not tolerate aricept.  Planning to start razadyne        Relevant Orders   TSH   TIA (transient ischemic attack)    W/up as outlined in previous note.  On daily aspirin.  Continues f/u with neurology.         Other Visit Diagnoses    Need for vaccination with 13-polyvalent pneumococcal conjugate vaccine    -  Primary    Relevant Orders    Pneumococcal conjugate vaccine 13-valent (Completed)      I spent 25 minutes with the patient and more than 50% of the time was spent in consultation regarding the above.     Dale DurhamSCOTT, Marga Gramajo, MD

## 2014-02-06 ENCOUNTER — Encounter: Payer: Self-pay | Admitting: Internal Medicine

## 2014-02-06 DIAGNOSIS — R739 Hyperglycemia, unspecified: Secondary | ICD-10-CM | POA: Insufficient documentation

## 2014-02-06 DIAGNOSIS — Z0000 Encounter for general adult medical examination without abnormal findings: Secondary | ICD-10-CM | POA: Insufficient documentation

## 2014-02-06 NOTE — Assessment & Plan Note (Signed)
Blood pressure doing well.  Same medication regimen.  Follow.    

## 2014-02-06 NOTE — Assessment & Plan Note (Signed)
Low carb diet and exercise.  Follow.  

## 2014-02-06 NOTE — Assessment & Plan Note (Signed)
Colonoscopy 02/08/12.  Bowels stable.

## 2014-02-06 NOTE — Assessment & Plan Note (Signed)
Schedule her for a physical next appt.

## 2014-02-06 NOTE — Assessment & Plan Note (Signed)
W/up as outlined in previous note.  On daily aspirin.  Continues f/u with neurology.

## 2014-02-06 NOTE — Assessment & Plan Note (Signed)
Low cholesterol diet and exercise.  On lipitor 20mg  q day.  Tolerating.  LDL just checked - 100.

## 2014-02-06 NOTE — Assessment & Plan Note (Signed)
Seeing Dr Sherryll BurgerShah.  Did not tolerate aricept.  Planning to start razadyne

## 2014-02-06 NOTE — Assessment & Plan Note (Signed)
Symptoms controlled.  On protonix.   

## 2014-04-28 NOTE — H&P (Signed)
PATIENT NAME:  Anita Santana, Anita M MR#:  [id number] DATE OF BIRTH:  1940-12-31  DATE OF ADMISSION:  06/28/2013  PRIMARY CARE PHYSICIAN: Dr. Dale Durhamharlene Scott at [facility].  REFERRING PHYSICIAN:  Dr. Jene Everyobert Kinner.   CHIEF COMPLAINT: Speech problem.   HISTORY OF PRESENT ILLNESS:  A 74 year old female with past medical history of hypertension and hypercholesterolemia, who lives an active and healthy life with her husband, with some slowness of her thinking and gradually developing forgetfulness now.  On Sunday, she was talking to her husband and husband noted that she had slurred speech and she is somewhat more confused, not answering improperly.  This episode lasted for two hours. On further questioning, she denies any complaint of headache, numbness, or weakness at that time. Denies any visual or hearing changes at that time.  The episode lasted for 2-3 hours and, after that, she became completely normal in her speech. As per husband for the last 1 or 2 years, she is gradually developing forgetfulness. Sometimes she forgets what thing she was trying to find out or what she was doing, and sometimes mood swings also, and does not want to do something, but she wanted to do just a few hours ago, but that is gradually building up now for the last 1-2 years and it did not change. Today, they called her primary care doctor and described about the symptoms on Sunday, and she told them to go to Emergency Room and have further work-up for stroke.  REVIEW OF SYSTEMS:  CONSTITUTIONAL: Negative for fever, fatigue, weakness, pain, or weight loss.  EYES: No blurring, double vision, discharge or redness.  EARS, NOSE, THROAT: No tinnitus, ear pain or hearing loss.  RESPIRATORY: No cough, wheezing, hemoptysis, or shortness of breath.  CARDIOVASCULAR: No chest pain, orthopnea, edema, arrhythmia or palpitations.  GASTROINTESTINAL: No nausea, vomiting, diarrhea or abdominal pain.  GENITOURINARY: No dysuria, hematuria, or  increased frequency.  ENDOCRINE: No heat or cold intolerance. No excessive sweating.  SKIN: No acne, rashes, or lesions.  MUSCULOSKELETAL: No pain or swelling in the joints.  NEUROLOGICAL: No numbness, weakness, tremor or vertigo, but had some speech problem on Sunday.  PSYCHIATRIC: No anxiety, insomnia, bipolar disorder.   PAST MEDICAL HISTORY:  1.  Hypertension.  2.  Hypercholesterolemia.   PAST SURGICAL HISTORY: Tonsil surgery many years ago.   SOCIAL HISTORY: Lives with her husband. 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 No smoking. No drinking or no illicit drug use.   FAMILY HISTORY: Mother had shoulder at 5675. Father had coronary artery disease at the age of 74.   MEDICATIONS: She had some diverticulitis last week and she was given Cipro and Flagyl last Thursday from the walk-in clinic. Other than that, she is taking aspirin every day. She also takes 1 medicine for hypertension and 1 for high cholesterol. Does not remember the name currently.  Needs to be confirmed by pharmacy.   PHYSICAL EXAMINATION:  VITAL SIGNS: In ER, temperature 98.7, pulse is 80, respirations 20, blood pressure 121/76. Pulse oximetry is 97% on room air.  GENERAL: The patient is fully alert and oriented to time, place, and person. Does not appear in any acute distress.  Cooperative with history taking and physical examination.  HEENT: Head and neck atraumatic. Conjunctivae pink.  Oral mucosa moist.  NECK: Supple. No JVD.  RESPIRATORY: Bilateral equal and clear air entry.  CARDIOVASCULAR: S1, S2 present, regular. No murmur.  ABDOMEN: Soft, nontender. Bowel sounds present. No organomegaly. SKIN:  No  rashes. LEGS: No edema.  NEUROLOGICAL: Power 5/5. Follows commands.  No adverse abnormality.  No tremor. Cranial nerves intact.  PSYCHIATRIC: Does not appear in to have any acute psychiatric illness. JOINTS: No swelling or tenderness.   IMPORTANT LABORATORY RESULTS: Glucose 113, BUN is 10,  Creatinine 0.99, sodium 138, potassium is 3.6, CO2 is 27, calcium is 9.3. WBC is 6.9, hemoglobin is 13.2, platelet count is 287, 000, MCV is 90, MCH is 30.   CT scan of the head without contrast is done, no acute abnormality.   ASSESSMENT AND PLAN: A 74 year old female with past history of hypertension and hypercholesterolemia, came with having episode of slurred speech three days ago, which is completely resolved now.   1.  Transient ischemic attack. Most likely that was an episode of transient ischemic attack, which was resolved. No residual symptoms. Sent by primary care physicians.  We will do further work-up.  Do MRI, echocardiogram, and carotid Doppler studies. She is already taking aspirin.   SHE IS ALLERGIC TO PLAVIX SO WE WILL NOT CHANGED IT.  We will check lipid panel.  2.  Hypertension. Currently blood pressure is stable, also was not given any medications.   3.  Hyperlipidemia. We will check lipid panel and currently we are giving atorvastatin.  4.  CODE STATUS: FULL CODE.   TOTAL TIME SPENT ON THIS ADMISSION: 50 minutes.     ____________________________ Hope Pigeon Elisabeth Pigeon, MD vgv:[id number] D: 06/28/2013 18:20:25 ET T: 06/28/2013 18:53:00 ET JOB#: [id number]  cc: Hope Pigeon. Elisabeth Pigeon, MD, <Dictator> Dale [place], MD Altamese Dilling MD ELECTRONICALLY SIGNED 07/01/2013 20:15

## 2014-04-28 NOTE — Discharge Summary (Signed)
Dates of Admission and Diagnosis:  Date of Admission 28-Jun-2013   Date of Discharge 29-Jun-2013   Admitting Diagnosis TIA   Final Diagnosis TIA Htn Hyperlipidmeia    Chief Complaint/History of Present Illness A 74 year old female with past medical history of hypertension and hypercholesterolemia, who lives an active and healthy life with her husband, with some slowness of her thinking and gradually developing forgetfulness now.  On 'Sunday, she was talking to her husband and husband noted that she had slurred speech and she is somewhat more confused, not answering improperly.  This episode lasted for two hours. On further questioning, she denies any complaint of headache, numbness, or weakness at that time. Denies any visual or hearing changes at that time.  The episode lasted for 2-3 hours and, after that, she became completely normal in her speech. As per husband for the last 1 or 2 years, she is gradually developing forgetfulness. Sometimes she forgets what thing she was trying to find out or what she was doing, and sometimes mood swings also, and does not want to do something, but she wanted to do just a few hours ago, but that is gradually building up now for the last 1-2 years and it did not change. Today, they called her primary care doctor and described about the symptoms on Sunday, and she told them to go to Emergency Room and have further work-up for stroke.   Allergies:  Plavix: Rash  Penicillin: Rash  Thyroid:  25-Jun-15 04:23   Thyroid Stimulating Hormone 1.37 (0.45-4.50 (International Unit)  ----------------------- Pregnant patients have  different reference  ranges for TSH:  - - - - - - - - - -  Pregnant, first trimetser:  0.36 - 2.50 uIU/mL)  Cardiology:  25-Jun-15 10:02   Echo Doppler REASON FOR EXAM:     COMMENTS:     PROCEDURE: [facility] - ECHO DOPPLER COMPLETE(TRANSTHOR)  - Jun 29 2013 10:02AM   RESULT: Echocardiogram Report  Patient Name:   Anita Santana Date of  Exam: 06/29/2013 Medical Rec #:  [id number]         Custom1: Date of Birth:  08/10/1940     Height:       65.0 in Patient Age:    72 years       Weight:       143.3 lb Patient Gender: F              BSA:          1.72 m??  Indications: CVA Sonographer:    Jerry Hege RDCS Referring Phys: VACHHANI, VAIBHAVKUMAR  Summary:  1. Left ventricular ejection fraction, by visual estimation, is 55 to  60%.  2. Normal global left ventricular systolic function.  3. Mild concentric left ventricular hypertrophy.  4. No cardiac source of embolism is identified. 2D AND M-MODE MEASUREMENTS (normal ranges within parentheses): Left Ventricle:          Normal IVSd (2D):      1.11 cm (0.7-1.1) LVPWd (2D):     1.23 cm (0.7-1.1) Aorta/LA:                  Normal LVIDd (2D):     4.19 cm (3.4-5.7) Aortic Root (2D): 2.90 cm (2.4-3.7) LVIDs (2D):     2.98 cm           Left Atrium (2D): 3.90 cm (1.9-4.0) LV FS (2D):     28.9 %   (>25%) LV EF (2D):     55' .9 %   (>  50%)                                   Right Ventricle:                                   RVd (2D):     4.68 cm LV DIASTOLIC FUNCTION: MV Peak E: 0.92 m/s E/e' Ratio: 13.60 MV Peak A: 0.99 m/s Decel Time: 246 msec E/A Ratio: 0.93 SPECTRAL DOPPLER ANALYSIS (where applicable): Mitral Valve: MV P1/2 Time: 71.34 msec MV Area, PHT: 3.08 cm?? Aortic Valve: AoV Max Vel: 1.13 m/s AoV Peak PG: 5.1 mmHg AoV Mean PG: LVOT Vmax: 1.08 m/s LVOT VTI:  LVOT Diameter: 2.10 cm AoV Area, Vmax: 3.31 cm?? AoV Area, VTI:  AoV Area, Vmn: Tricuspid Valve and PA/RV Systolic Pressure: TR Max Velocity: 2.39 m/s RA   Pressure: 5 mmHg RVSP/PASP: 27.9 mmHg Pulmonic Valve: PV Max Velocity: 0.79 m/s PV Max PG: 2.5 mmHg PV Mean PG:  PHYSICIAN INTERPRETATION: Left Ventricle: The left ventricular internal cavity size was normal.  Mild concentric left ventricular hypertrophy. Global LV systolic function  was normal. Left ventricular ejection fraction, by visual estimation, is  55  to 60%. Spectral Doppler shows normal pattern of LV diastolic filling. Right Ventricle: Normal right ventricular size, wall thickness, and  systolic function. Left Atrium: The left atrium is normal in size and structure. Right Atrium: The right atrium is normal in size and structure. Pericardium: There is no evidence of pericardial effusion. Mitral Valve: The mitralvalve is normal in structure. No evidence of   mitral valve stenosis. Trace mitral valve regurgitation is seen. Tricuspid Valve: The tricuspid valve is normal. Trivial tricuspid  regurgitation is visualized. The tricuspid regurgitant velocity is 2.39  m/s, and with an assumed right atrial pressure of 5 mmHg, the estimated  right ventricular systolic pressure is normal at 27.9 mmHg. Aortic Valve: The aortic valve is trileaflet and structurally normal,  with normal leaflet excursion; without any evidence of aortic stenosis or  insufficiency. Pulmonic Valve: The pulmonic valve is not well seen. Trace pulmonic valve  regurgitation. Aorta: The aortic root is normal in size and structure. Venous: The inferior vena cava was not well visualized.  [id number] Kathlyn Sacramento MD Electronically signed by [id number] Kathlyn Sacramento MD Signature Date/Time: 06/29/2013/2:53:24 PM  *** Final ***  IMPRESSION: .    Verified By: Mertie Clause. Fletcher Anon, M.D., MD  Routine Chem:  24-Jun-15 15:58   Glucose, Serum  116  BUN 10  Creatinine (comp) 0.99  Sodium, Serum 138  Potassium, Serum 3.6  Chloride, Serum 105  CO2, Serum 27  Calcium (Total), Serum 9.3  Anion Gap  6  Osmolality (calc) 276  eGFR (African American) >60  eGFR (Non-African American)  57 (eGFR values <50m/min/1.73 m2 may be an indication of chronic kidney disease (CKD). Calculated eGFR is useful in patients with stable renal function. The eGFR calculation will not be reliable in acutely ill patients when serum creatinine is changing rapidly. It is not useful in  patients on dialysis.  The eGFR calculation may not be applicable to patients at the low and high extremes of body sizes, pregnant women, and vegetarians.)  25-Jun-15 04:23   Glucose, Serum 95  BUN 9  Creatinine (comp) 0.94  Sodium, Serum 140  Potassium, Serum 3.6  Chloride, Serum 106  CO2, Serum 28  Calcium (Total), Serum 9.1  Anion Gap  6  Osmolality (calc) 278  eGFR (African American) >60  eGFR (Non-African American) >60 (eGFR values <18m/min/1.73 m2 may be an indication of chronic kidney disease (CKD). Calculated eGFR is useful in patients with stable renal function. The eGFR calculation will not be reliable in acutely ill patients when serum creatinine is changing rapidly. It is not useful in  patients on dialysis. The eGFR calculation may not be applicable to patients at the low and high extremes of body sizes, pregnant women, and vegetarians.)  Hemoglobin A1c ([facility]) 6.0 (The American Diabetes Association recommends that a primary goal of therapy should be <7% and that physicians should reevaluate the treatment regimen in patients with HbA1c values consistently >8%.)  Cholesterol, Serum 133  Triglycerides, Serum 177  HDL (INHOUSE) 42  VLDL Cholesterol Calculated 35  LDL Cholesterol Calculated 56 (Result(s) reported on 29 Jun 2013 at 05:14AM.)  Routine Hem:  24-Jun-15 15:58   WBC (CBC) 6.9  RBC (CBC) 4.39  Hemoglobin (CBC) 13.2  Hematocrit (CBC) 39.6  Platelet Count (CBC) 287  MCV 90  MCH 30.0  MCHC 33.3  RDW 13.3  Neutrophil % 53.3  Lymphocyte % 32.3  Monocyte % 8.3  Eosinophil % 4.8  Basophil % 1.3  Neutrophil # 3.7  Lymphocyte # 2.2  Monocyte # 0.6  Eosinophil # 0.3  Basophil # 0.1 (Result(s) reported on 28 Jun 2013 at 04:38PM.)  25-Jun-15 04:23   WBC (CBC) 6.1  RBC (CBC) 4.02  Hemoglobin (CBC) 12.3  Hematocrit (CBC) 36.3  Platelet Count (CBC) 266  MCV 90  MCH 30.7  MCHC 34.0  RDW 13.0  Neutrophil % 51.3  Lymphocyte % 36.3  Monocyte % 7.2  Eosinophil % 3.9   Basophil % 1.3  Neutrophil # 3.1  Lymphocyte # 2.2  Monocyte # 0.4  Eosinophil # 0.2  Basophil # 0.1 (Result(s) reported on 29 Jun 2013 at 05:15AM.)   PERTINENT RADIOLOGY STUDIES: [place]    25-Jun-15 10:12, [place] Doppler Bilateral  [place] Doppler Bilateral   REASON FOR EXAM:    TIA  COMMENTS:       PROCEDURE: [place] - [place] DOPPLER BILATERAL  - Jun 29 2013 10:12AM     CLINICAL DATA:  TIA    EXAM:  BILATERAL CAROTID DUPLEX ULTRASOUND    TECHNIQUE:  GPearline Cablesscale imaging, color Doppler and duplex ultrasoundwere  performed of bilateral carotid and vertebral arteries in the neck.    COMPARISON:  None.  FINDINGS:  Criteria: Quantification of carotid stenosis is based on velocity  parameters that correlate the residual internal carotid diameter  with NASCET-based stenosis levels, using the diameter of the distal  internal carotid lumen as the denominator for stenosis measurement.    The following velocity measurements were obtained:    RIGHT    ICA:  63 cm/sec    CCA:  99 cm/sec    SYSTOLIC ICA/CCA RATIO:  07.62 DIASTOLIC ICA/CCA RATIO:    ECA:  52 cm/sec    LEFT    ICA:  69 cm/sec    CCA:  1263cm/sec    SYSTOLIC ICA/CCA RATIO:  03.35   DIASTOLIC ICA/CCA RATIO:    ECA:  63 cm/sec  RIGHT CAROTID ARTERY: Little if any plaque in the bulb. Low  resistance internal carotid Doppler pattern.    RIGHT VERTEBRAL ARTERY:  Antegrade.  Normal Doppler pattern.    LEFT CAROTID ARTERY: Minimal plaque in the bulb. Low resistance  internal carotid Doppler pattern.    LEFT VERTEBRAL ARTERY:  Antegrade.  Normal Doppler pattern.     IMPRESSION:  Less than 50% stenosis in the right and left internal carotid  arteries.    Electronically Signed    By: Maryclare Bean M.D.    On: 06/29/2013 10:24         Verified By: Jamas Lav, M.D.,  [place]:    24-Jun-15 15:58, CT Head Without Contrast  PACS Image     25-Jun-15 08:46, MRI Brain Without Contrast  PACS  Image     25-Jun-15 10:12, US Carotid Doppler Bilateral  PACS Image   MRI:    25-Jun-15 08:46, MRI Brain Without Contrast  MRI Brain Without Contrast   REASON FOR EXAM:    TIA  COMMENTS:       PROCEDURE: MR  - MR BRAIN WO CONTRAST  - Jun 29 2013  8:46AM     CLINICAL DATA:  Difficulty with speech lasting a few minutes.  Confusion.    EXAM:  MRI HEAD WITHOUT CONTRAST    TECHNIQUE:  Multiplanar, multiecho pulse sequences of the brain and surrounding  structures were obtained without intravenous contrast.  COMPARISON:  06/28/2013 CT.  No comparison MR.    FINDINGS:  No acute infarct.    No intracranial hemorrhage.    Mild small vessel disease type changes.    No intracranial mass lesion noted on this unenhanced exam.    Mild age related global atrophy without hydrocephalus.    Right vertebral artery poorly delineated and possibly narrowed or  occluded. Remainder of major intracranial vascular structures are  patent.    Partially empty sella.    Cervical medullary junction, pineal region and orbital structures  unremarkable.     IMPRESSION:  No acute infarct.    Mild small vessel disease type changes.    Right vertebral artery poorly delineated and possibly narrowed or  occluded.  Electronically Signed    By: Chauncey Cruel M.D.    On: 06/29/2013 08:56         Verified By: Doug Sou, M.D.,  CT:    24-Jun-15 15:58, CT Head Without Contrast  CT Head Without Contrast   REASON FOR EXAM:    slurred speech  COMMENTS:       PROCEDURE: CT  - CT HEAD WITHOUT CONTRAST  - Jun 28 2013  3:58PM     CLINICAL DATA:  Slurred speech and confusion.    EXAM:  CT HEAD WITHOUT CONTRAST    TECHNIQUE:  Contiguous axial images were obtained from the base of the skull  through the vertex without intravenous contrast.    COMPARISON:  None  FINDINGS:  Cerebral volume is normal for age.  Negative for hydrocephalus.    Negative for acute infarct, hemorrhage, or mass  lesion. Calvarium is  intact.     IMPRESSION:  No acute abnormality.      Electronically Signed    By: Franchot Gallo M.D.    On: 06/28/2013 16:00       Verified By: Truett Perna, M.D.,   Pertinent Past History:  Pertinent Past History 1.  Hypertension.  2.  Hypercholesterolemia.   Hospital Course:  Hospital Course 1.  Transient ischemic attack. Most likely that was an episode of transient ischemic attack, which was resolved. No residual symptoms. Sent by primary care physicians.     MRI and caritod doppler non contributory.     Awaited Echo result- may d/c  after that is reviewed- if not significant.     pt is already on ASA- Alergic to Plavix.     On statin- lipid panel result is good.  2.  Hypertension. Currently blood pressure is stable, also was not given any medications.   3.  Hyperlipidemia.LDL<70. Acceptable.   Condition on Discharge Guarded   Code Status:  Code Status Full Code   DISCHARGE INSTRUCTIONS HOME MEDS:  Medication Reconciliation: Patient's Home Medications at Discharge:     Medication Instructions  lisinopril 40 mg oral tablet  1 tab(s) orally once a day   metronidazole 500 mg oral tablet  1 cap(s) orally 4 times a day   simvastatin 10 mg oral tablet  1 tab(s) orally once a day (at bedtime)   protonix 40 mg oral delayed release tablet  1 tab(s) orally once a day   ciprofloxacin 500 mg oral tablet  1 tab(s) orally every 12 hours   aspirin-dipyridamole 25 mg-200 mg oral capsule, extended release  1 cap(s) orally 2 times a day    PRESCRIPTIONS: PRINTED AND PLACED ON CHART  STOP TAKING THE FOLLOWING MEDICATION(S):    aspir 81 81 mg oral tablet: 1 tab(s) orally once a day  Physician's Instructions:  Diet Low Sodium  Low Fat, Low Cholesterol   Diet Consistency Regular Consistency   Activity Limitations As tolerated   Return to Work Not Applicable   Time frame for Follow Up Appointment 1-2 weeks  PMD   Time frame for Follow Up  Appointment 2-4 weeks  Neurology clinic     [place], Charlene(Family Physician): [facility] [place], [address], [place], [place] [address], [place] [phone]  Electronic Signatures: Vaughan Basta (MD)  (Signed 27-Jun-15 12:45)  Authored: ADMISSION DATE AND DIAGNOSIS, CHIEF COMPLAINT/HPI, Allergies, PERTINENT LABS, PERTINENT RADIOLOGY STUDIES, PERTINENT PAST HISTORY, HOSPITAL COURSE, [place], PATIENT INSTRUCTIONS, Follow Up Physician   Last Updated: 27-Jun-15 12:45 by Vaughan Basta (MD)

## 2014-05-18 ENCOUNTER — Other Ambulatory Visit: Payer: Self-pay | Admitting: Internal Medicine

## 2014-05-23 ENCOUNTER — Telehealth: Payer: Self-pay | Admitting: *Deleted

## 2014-05-23 NOTE — Telephone Encounter (Signed)
Let them know that I will be gone the end of the week and beginning of next week.  I can see her 11:00 on 05/30/14 (11:00 - open spot - block 30 min).  Block spot until can confirm pt coming in.

## 2014-05-23 NOTE — Telephone Encounter (Signed)
Pt daughter called and wanted to see if Dr. Lorin PicketScott can see the pt for a f/u appt. Pt husband stating that she is depressed and is sleeping a lot and is having crying spells. . Family is very worried. Pt husband contact Dr. Sherryll BurgerShah the Neurologist and the RN recommended that he contact the PCP. Please advise MD.

## 2014-05-24 NOTE — Telephone Encounter (Signed)
That appt has been taken, hospital f/u. Another date/time?

## 2014-05-24 NOTE — Telephone Encounter (Signed)
See if she can come in on 06/30/14 at 2:00 (30 min)

## 2014-05-24 NOTE — Telephone Encounter (Signed)
Spoke to daughter, Eilene GhaziJeannine, scheduled 05/29/14 @ 4:30

## 2014-05-24 NOTE — Telephone Encounter (Signed)
Left message to return my call.  

## 2014-05-29 ENCOUNTER — Ambulatory Visit: Payer: Commercial Managed Care - HMO | Admitting: Internal Medicine

## 2014-06-05 ENCOUNTER — Other Ambulatory Visit (INDEPENDENT_AMBULATORY_CARE_PROVIDER_SITE_OTHER): Payer: Commercial Managed Care - HMO

## 2014-06-05 DIAGNOSIS — I10 Essential (primary) hypertension: Secondary | ICD-10-CM

## 2014-06-05 DIAGNOSIS — E7800 Pure hypercholesterolemia, unspecified: Secondary | ICD-10-CM

## 2014-06-05 DIAGNOSIS — R413 Other amnesia: Secondary | ICD-10-CM

## 2014-06-05 DIAGNOSIS — R739 Hyperglycemia, unspecified: Secondary | ICD-10-CM | POA: Diagnosis not present

## 2014-06-05 LAB — TSH: TSH: 1.9 u[IU]/mL (ref 0.35–4.50)

## 2014-06-05 LAB — BASIC METABOLIC PANEL
BUN: 19 mg/dL (ref 6–23)
CHLORIDE: 98 meq/L (ref 96–112)
CO2: 29 meq/L (ref 19–32)
CREATININE: 0.87 mg/dL (ref 0.40–1.20)
Calcium: 9.5 mg/dL (ref 8.4–10.5)
GFR: 67.72 mL/min (ref >60.00)
Glucose, Bld: 95 mg/dL (ref 70–99)
Potassium: 4.3 mEq/L (ref 3.5–5.1)
Sodium: 134 mEq/L — ABNORMAL LOW (ref 135–145)

## 2014-06-05 LAB — LIPID PANEL
Cholesterol: 175 mg/dL (ref 0–200)
HDL: 58.7 mg/dL (ref >39.00)
LDL Cholesterol: 96 mg/dL (ref 0–99)
NONHDL: 116.3
Total CHOL/HDL Ratio: 3
Triglycerides: 103 mg/dL (ref 0.0–149.0)
VLDL: 20.6 mg/dL (ref 0.0–40.0)

## 2014-06-05 LAB — HEPATIC FUNCTION PANEL
ALK PHOS: 78 U/L (ref 39–117)
ALT: 22 U/L (ref 0–35)
AST: 23 U/L (ref 0–37)
Albumin: 4.3 g/dL (ref 3.5–5.2)
BILIRUBIN DIRECT: 0.1 mg/dL (ref 0.0–0.3)
BILIRUBIN TOTAL: 0.6 mg/dL (ref 0.2–1.2)
Total Protein: 7.2 g/dL (ref 6.0–8.3)

## 2014-06-05 LAB — HEMOGLOBIN A1C: Hgb A1c MFr Bld: 5.7 % (ref 4.6–6.5)

## 2014-06-07 ENCOUNTER — Ambulatory Visit (INDEPENDENT_AMBULATORY_CARE_PROVIDER_SITE_OTHER): Payer: Commercial Managed Care - HMO | Admitting: Internal Medicine

## 2014-06-07 ENCOUNTER — Encounter: Payer: Self-pay | Admitting: Internal Medicine

## 2014-06-07 VITALS — BP 123/65 | HR 61 | Temp 98.1°F | Ht 65.0 in | Wt 138.2 lb

## 2014-06-07 DIAGNOSIS — R739 Hyperglycemia, unspecified: Secondary | ICD-10-CM

## 2014-06-07 DIAGNOSIS — I10 Essential (primary) hypertension: Secondary | ICD-10-CM

## 2014-06-07 DIAGNOSIS — K219 Gastro-esophageal reflux disease without esophagitis: Secondary | ICD-10-CM

## 2014-06-07 DIAGNOSIS — E7800 Pure hypercholesterolemia, unspecified: Secondary | ICD-10-CM

## 2014-06-07 DIAGNOSIS — Z0000 Encounter for general adult medical examination without abnormal findings: Secondary | ICD-10-CM | POA: Diagnosis not present

## 2014-06-07 DIAGNOSIS — F439 Reaction to severe stress, unspecified: Secondary | ICD-10-CM

## 2014-06-07 DIAGNOSIS — R413 Other amnesia: Secondary | ICD-10-CM

## 2014-06-07 DIAGNOSIS — G459 Transient cerebral ischemic attack, unspecified: Secondary | ICD-10-CM

## 2014-06-07 DIAGNOSIS — R928 Other abnormal and inconclusive findings on diagnostic imaging of breast: Secondary | ICD-10-CM

## 2014-06-07 DIAGNOSIS — E871 Hypo-osmolality and hyponatremia: Secondary | ICD-10-CM

## 2014-06-07 DIAGNOSIS — Z1239 Encounter for other screening for malignant neoplasm of breast: Secondary | ICD-10-CM

## 2014-06-07 NOTE — Progress Notes (Signed)
Pre visit review using our clinic review tool, if applicable. No additional management support is needed unless otherwise documented below in the visit note. 

## 2014-06-07 NOTE — Progress Notes (Signed)
Patient ID: JANEYA DEYO, female   DOB: 12-Sep-1940, 74 y.o.   MRN: [id number]   Subjective:    Patient ID: MARIACLARA SPEAR, female    DOB: 04/11/1940, 73 y.o.   MRN: [id number]  HPI  Patient here to follow up on her current medical issues as well as for a complete physical exam.  She reports she is doing well.  Has lost weight.  Eating and drinking.   No acid reflux.   No nausea or vomiting.  Bowels stable.  No cardiac symptoms with increased activity or exertion.  Breathing stable.  Denies depression.     Past Medical History  Diagnosis Date  . GERD (gastroesophageal reflux disease)   . Hypertension   . Hypercholesteremia   . TIA (transient ischemic attack)   . Syncope and collapse   . Diverticulosis of colon 02/08/12    Colonoscopy    Current Outpatient Prescriptions on File Prior to Visit  Medication Sig Dispense Refill  . acetaminophen (TYLENOL ARTHRITIS PAIN) 650 MG CR tablet Take 650 mg by mouth as needed.    Marland Kitchen aspirin 81 MG tablet Take 81 mg by mouth daily.    Marland Kitchen atorvastatin (LIPITOR) 20 MG tablet TAKE 1 TABLET EVERY DAY 90 tablet 1  . hydrochlorothiazide (HYDRODIURIL) 25 MG tablet TAKE 1/2 TABLET EVERY DAY 45 tablet 1  . lisinopril (PRINIVIL,ZESTRIL) 40 MG tablet TAKE 1 TABLET EVERY DAY 90 tablet 1  . pantoprazole (PROTONIX) 40 MG tablet TAKE 1 TABLET EVERY DAY 90 tablet 1  . Psyllium (METAMUCIL PO) Take by mouth daily.    . ranitidine (ZANTAC) 150 MG tablet Take 150 mg by mouth as needed.    . simethicone (GAS-X) 80 MG chewable tablet Chew 80 mg by mouth as needed.     No current facility-administered medications on file prior to visit.    Review of Systems  Constitutional: Negative for fever.       Has lost weight.   HENT: Negative for congestion and sinus pressure.   Eyes: Negative for pain and visual disturbance.  Respiratory: Negative for cough, chest tightness and shortness of breath.   Cardiovascular: Negative for chest pain, palpitations and leg swelling.    Gastrointestinal: Negative for nausea, vomiting, abdominal pain and diarrhea.  Genitourinary: Negative for dysuria and difficulty urinating.  Musculoskeletal: Negative for back pain and joint swelling.  Skin: Negative for color change and rash.  Neurological: Negative for dizziness, light-headedness and headaches.  Hematological: Negative for adenopathy. Does not bruise/bleed easily.  Psychiatric/Behavioral: Negative for decreased concentration and agitation.       Objective:     Blood pressure recheck:  132/70  Physical Exam  Constitutional: She is oriented to person, place, and time. She appears well-developed and well-nourished.  HENT:  Nose: Nose normal.  Mouth/Throat: Oropharynx is clear and moist.  Eyes: Right eye exhibits no discharge. Left eye exhibits no discharge. No scleral icterus.  Neck: Neck supple. No thyromegaly present.  Cardiovascular: Normal rate and regular rhythm.   Pulmonary/Chest: Breath sounds normal. No accessory muscle usage. No tachypnea. No respiratory distress. She has no decreased breath sounds. She has no wheezes. She has no rhonchi. Right breast exhibits no inverted nipple, no mass, no nipple discharge and no tenderness (no axillary adenopathy). Left breast exhibits no inverted nipple, no mass, no nipple discharge and no tenderness (no axilarry adenopathy).  Abdominal: Soft. Bowel sounds are normal. There is no tenderness.  Musculoskeletal: She exhibits no edema or tenderness.  Lymphadenopathy:  She has no cervical adenopathy.  Neurological: She is alert and oriented to person, place, and time.  Skin: Skin is warm. No rash noted.  Psychiatric: She has a normal mood and affect. Her behavior is normal.    BP 123/65 mmHg  Pulse 61  Temp(Src) 98.1 F (36.7 C) (Oral)  Ht 5' 5" (1.651 m)  Wt 138 lb 4 oz (62.71 kg)  BMI 23.01 kg/m2  SpO2 99% Wt Readings from Last 3 Encounters:  06/07/14 138 lb 4 oz (62.71 kg)  02/05/14 143 lb 8 oz (65.091 kg)   10/05/13 148 lb (67.132 kg)     Lab Results  Component Value Date   WBC 5.7 10/03/2013   HGB 12.8 10/03/2013   HCT 37.7 10/03/2013   PLT 219.0 10/03/2013   GLUCOSE 95 06/05/2014   CHOL 175 06/05/2014   TRIG 103.0 06/05/2014   HDL 58.70 06/05/2014   LDLDIRECT 237.0 07/15/2012   LDLCALC 96 06/05/2014   ALT 22 06/05/2014   AST 23 06/05/2014   NA 134* 06/05/2014   K 4.3 06/05/2014   CL 98 06/05/2014   CREATININE 0.87 06/05/2014   BUN 19 06/05/2014   CO2 29 06/05/2014   TSH 1.90 06/05/2014   HGBA1C 5.7 06/05/2014       Assessment & Plan:   Problem List Items Addressed This Visit    Abnormal mammogram    Mammogram 04/07/13 - [facility] II.  Schedule f/u mammogram.       GERD (gastroesophageal reflux disease)    On protonix.  Symptoms controlled.       Health care maintenance    Schedule mammogram as outlined.  Physical today 06/07/14.  Colonoscopy 02/08/12.       Hypercholesteremia    Low cholesterol diet and exercise.  Follow lipid panel.        Hyperglycemia    Low carb diet and exercise.  Follow met b and a1c.  A1c just checked 5.7.        Hypertension    Blood pressure doing well.  Same medication regimen.  Follow pressures.  Follow metabolic panel.       Memory change    She denies any significant change.  Seeing Dr Manuella Ghazi.  Follow.        Stress    She feels she is doing well.  Denies depression.        TIA (transient ischemic attack)    W/up as outlined previously.  On aspirin.  Continues f/u with neurology.        Other Visit Diagnoses    Hyponatremia    -  Primary    Relevant Orders    Sodium    Screening breast examination        Relevant Orders    MM DIGITAL SCREENING BILATERAL      I spent 25 minutes with the patient and more than 50% of the time was spent in consultation regarding the above.     Einar Pheasant, MD

## 2014-06-11 ENCOUNTER — Encounter: Payer: Self-pay | Admitting: Internal Medicine

## 2014-06-11 NOTE — Assessment & Plan Note (Signed)
Mammogram 04/07/13 - Birads II.  Schedule f/u mammogram.

## 2014-06-11 NOTE — Assessment & Plan Note (Signed)
She denies any significant change.  Seeing Dr Sherryll BurgerShah.  Follow.

## 2014-06-11 NOTE — Assessment & Plan Note (Signed)
She feels she is doing well.  Denies depression.

## 2014-06-11 NOTE — Assessment & Plan Note (Signed)
W/up as outlined previously.  On aspirin.  Continues f/u with neurology.

## 2014-06-11 NOTE — Assessment & Plan Note (Signed)
Low cholesterol diet and exercise.  Follow lipid panel.   

## 2014-06-11 NOTE — Assessment & Plan Note (Signed)
On protonix.  Symptoms controlled.   

## 2014-06-11 NOTE — Assessment & Plan Note (Signed)
Low carb diet and exercise.  Follow met b and a1c.  A1c just checked 5.7.

## 2014-06-11 NOTE — Assessment & Plan Note (Signed)
Schedule mammogram as outlined.  Physical today 06/07/14.  Colonoscopy 02/08/12.

## 2014-06-11 NOTE — Assessment & Plan Note (Signed)
Blood pressure doing well.  Same medication regimen.  Follow pressures.  Follow metabolic panel.   

## 2014-06-14 ENCOUNTER — Other Ambulatory Visit: Payer: Self-pay | Admitting: Internal Medicine

## 2014-06-14 ENCOUNTER — Ambulatory Visit
Admission: RE | Admit: 2014-06-14 | Discharge: 2014-06-14 | Disposition: A | Discharge status: Home / Self Care | Payer: Commercial Managed Care - HMO | Source: Ambulatory Visit | Attending: Internal Medicine | Admitting: Internal Medicine

## 2014-06-14 DIAGNOSIS — Z1239 Encounter for other screening for malignant neoplasm of breast: Secondary | ICD-10-CM

## 2014-06-14 DIAGNOSIS — Z1231 Encounter for screening mammogram for malignant neoplasm of breast: Secondary | ICD-10-CM | POA: Diagnosis not present

## 2014-06-17 DIAGNOSIS — R55 Syncope and collapse: Secondary | ICD-10-CM | POA: Diagnosis not present

## 2014-06-17 DIAGNOSIS — R404 Transient alteration of awareness: Secondary | ICD-10-CM | POA: Diagnosis not present

## 2014-06-17 DIAGNOSIS — G319 Degenerative disease of nervous system, unspecified: Secondary | ICD-10-CM | POA: Diagnosis not present

## 2014-06-17 DIAGNOSIS — Z8673 Personal history of transient ischemic attack (TIA), and cerebral infarction without residual deficits: Secondary | ICD-10-CM | POA: Diagnosis not present

## 2014-06-17 DIAGNOSIS — F0390 Unspecified dementia without behavioral disturbance: Secondary | ICD-10-CM | POA: Diagnosis not present

## 2014-06-17 DIAGNOSIS — I2510 Atherosclerotic heart disease of native coronary artery without angina pectoris: Secondary | ICD-10-CM | POA: Diagnosis not present

## 2014-06-17 DIAGNOSIS — R569 Unspecified convulsions: Secondary | ICD-10-CM | POA: Diagnosis not present

## 2014-06-17 DIAGNOSIS — I10 Essential (primary) hypertension: Secondary | ICD-10-CM | POA: Diagnosis not present

## 2014-06-28 ENCOUNTER — Other Ambulatory Visit: Payer: Commercial Managed Care - HMO

## 2014-09-07 ENCOUNTER — Encounter: Payer: Self-pay | Admitting: Internal Medicine

## 2014-09-07 ENCOUNTER — Ambulatory Visit (INDEPENDENT_AMBULATORY_CARE_PROVIDER_SITE_OTHER): Payer: Commercial Managed Care - HMO | Admitting: Internal Medicine

## 2014-09-07 VITALS — BP 120/80 | HR 66 | Temp 98.4°F | Ht 65.0 in | Wt 139.4 lb

## 2014-09-07 DIAGNOSIS — E7800 Pure hypercholesterolemia, unspecified: Secondary | ICD-10-CM

## 2014-09-07 DIAGNOSIS — E871 Hypo-osmolality and hyponatremia: Secondary | ICD-10-CM

## 2014-09-07 DIAGNOSIS — Z23 Encounter for immunization: Secondary | ICD-10-CM

## 2014-09-07 DIAGNOSIS — G459 Transient cerebral ischemic attack, unspecified: Secondary | ICD-10-CM

## 2014-09-07 DIAGNOSIS — R413 Other amnesia: Secondary | ICD-10-CM | POA: Diagnosis not present

## 2014-09-07 DIAGNOSIS — R739 Hyperglycemia, unspecified: Secondary | ICD-10-CM

## 2014-09-07 DIAGNOSIS — I10 Essential (primary) hypertension: Secondary | ICD-10-CM | POA: Diagnosis not present

## 2014-09-07 DIAGNOSIS — Z658 Other specified problems related to psychosocial circumstances: Secondary | ICD-10-CM

## 2014-09-07 DIAGNOSIS — F439 Reaction to severe stress, unspecified: Secondary | ICD-10-CM

## 2014-09-07 DIAGNOSIS — K219 Gastro-esophageal reflux disease without esophagitis: Secondary | ICD-10-CM

## 2014-09-07 DIAGNOSIS — R928 Other abnormal and inconclusive findings on diagnostic imaging of breast: Secondary | ICD-10-CM

## 2014-09-07 LAB — HEPATIC FUNCTION PANEL
ALBUMIN: 4.5 g/dL (ref 3.5–5.2)
ALK PHOS: 79 U/L (ref 39–117)
ALT: 16 U/L (ref 0–35)
AST: 20 U/L (ref 0–37)
Bilirubin, Direct: 0.1 mg/dL (ref 0.0–0.3)
TOTAL PROTEIN: 7.2 g/dL (ref 6.0–8.3)
Total Bilirubin: 0.8 mg/dL (ref 0.2–1.2)

## 2014-09-07 LAB — BASIC METABOLIC PANEL
BUN: 16 mg/dL (ref 6–23)
CO2: 32 mEq/L (ref 19–32)
CREATININE: 0.81 mg/dL (ref 0.40–1.20)
Calcium: 9.7 mg/dL (ref 8.4–10.5)
Chloride: 103 mEq/L (ref 96–112)
GFR: 73.49 mL/min (ref >60.00)
Glucose, Bld: 96 mg/dL (ref 70–99)
POTASSIUM: 4.3 meq/L (ref 3.5–5.1)
Sodium: 141 mEq/L (ref 135–145)

## 2014-09-07 LAB — CBC WITH DIFFERENTIAL/PLATELET
Basophils Absolute: 0 10*3/uL (ref 0.0–0.1)
Basophils Relative: 0.5 % (ref 0.0–3.0)
EOS ABS: 0.2 10*3/uL (ref 0.0–0.7)
Eosinophils Relative: 2 % (ref 0.0–5.0)
HEMATOCRIT: 41.1 % (ref 36.0–46.0)
HEMOGLOBIN: 13.7 g/dL (ref 12.0–15.0)
LYMPHS PCT: 26.1 % (ref 12.0–46.0)
Lymphs Abs: 2 10*3/uL (ref 0.7–4.0)
MCHC: 33.2 g/dL (ref 30.0–36.0)
MCV: 91.5 fl (ref 78.0–100.0)
Monocytes Absolute: 0.4 10*3/uL (ref 0.1–1.0)
Monocytes Relative: 5.1 % (ref 3.0–12.0)
NEUTROS ABS: 5.1 10*3/uL (ref 1.4–7.7)
Neutrophils Relative %: 66.3 % (ref 43.0–77.0)
PLATELETS: 241 10*3/uL (ref 150.0–400.0)
RBC: 4.49 Mil/uL (ref 3.87–5.11)
RDW: 13.6 % (ref 11.5–15.5)
WBC: 7.7 10*3/uL (ref 4.0–10.5)

## 2014-09-07 LAB — LIPID PANEL
CHOL/HDL RATIO: 3
Cholesterol: 164 mg/dL (ref 0–200)
HDL: 55.2 mg/dL (ref >39.00)
LDL Cholesterol: 84 mg/dL (ref 0–99)
NonHDL: 108.77
TRIGLYCERIDES: 123 mg/dL (ref 0.0–149.0)
VLDL: 24.6 mg/dL (ref 0.0–40.0)

## 2014-09-07 LAB — HEMOGLOBIN A1C: Hgb A1c MFr Bld: 5.4 % (ref 4.6–6.5)

## 2014-09-07 LAB — SODIUM: Sodium: 141 mEq/L (ref 135–145)

## 2014-09-07 NOTE — Progress Notes (Signed)
Pre-visit discussion using our clinic review tool. No additional management support is needed unless otherwise documented below in the visit note.  

## 2014-09-07 NOTE — Progress Notes (Signed)
Patient ID: Anita Santana, female   DOB: [birth year], 74 y.o.   MRN: [id number]   Subjective:    Patient ID: Anita Santana, female    DOB: 1940/05/24, 74 y.o.   MRN: [id number]  HPI  Patient here for a scheduled follow up.  Weight is stable from the last check.  States she is eating well.  No sob.  No acid reflux.  No abdominal pain or cramping.  Bowels stable.  Feels memory is stable.  Feels she is handling stress well.     Past Medical History  Diagnosis Date  . GERD (gastroesophageal reflux disease)   . Hypertension   . Hypercholesteremia   . TIA (transient ischemic attack)   . Syncope and collapse   . Diverticulosis of colon 02/08/12    Colonoscopy   Past Surgical History  Procedure Laterality Date  . Tonsilectomy/adenoidectomy with myringotomy  1950  . Cataract surgery  2012   Family History  Problem Relation Age of Onset  . Hypercholesterolemia Mother   . Stroke Mother   . Hypertension Mother   . Cancer Mother     ovarian/uterus  . Hypercholesterolemia Father   . Heart disease Father   . Heart attack Father   . Cancer Maternal Grandmother     unsure of what type   Social History   Social History  . Marital Status: Married    Spouse Name: N/A  . Number of Children: N/A  . Years of Education: N/A   Social History Main Topics  . Smoking status: Never Smoker   . Smokeless tobacco: Never Used  . Alcohol Use: No  . Drug Use: No  . Sexual Activity: Not Asked   Other Topics Concern  . None   Social History Narrative    Outpatient Encounter Prescriptions as of 09/07/2014  Medication Sig  . acetaminophen (TYLENOL ARTHRITIS PAIN) 650 MG CR tablet Take 650 mg by mouth as needed.  Marland Kitchen aspirin 81 MG tablet Take 81 mg by mouth daily.  Marland Kitchen atorvastatin (LIPITOR) 20 MG tablet TAKE 1 TABLET EVERY DAY  . hydrochlorothiazide (HYDRODIURIL) 25 MG tablet TAKE 1/2 TABLET EVERY DAY  . lisinopril (PRINIVIL,ZESTRIL) 40 MG tablet TAKE 1 TABLET EVERY DAY  . pantoprazole (PROTONIX)  40 MG tablet TAKE 1 TABLET EVERY DAY  . Psyllium (METAMUCIL PO) Take by mouth daily.  . ranitidine (ZANTAC) 150 MG tablet Take 150 mg by mouth as needed.  . simethicone (GAS-X) 80 MG chewable tablet Chew 80 mg by mouth as needed.   No facility-administered encounter medications on file as of 09/07/2014.    Review of Systems  Constitutional: Negative for appetite change.       Weight is stable.    HENT: Negative for congestion and sinus pressure.   Respiratory: Negative for cough, chest tightness and shortness of breath.   Cardiovascular: Negative for chest pain, palpitations and leg swelling.  Gastrointestinal: Negative for nausea, vomiting, abdominal pain and diarrhea.  Genitourinary: Negative for dysuria and difficulty urinating.  Skin: Negative for color change and rash.  Neurological: Negative for dizziness, light-headedness and headaches.  Psychiatric/Behavioral: Negative for dysphoric mood and agitation.       Objective:    Physical Exam  Constitutional: She appears well-developed and well-nourished. No distress.  HENT:  Nose: Nose normal.  Mouth/Throat: Oropharynx is clear and moist.  Neck: Neck supple. No thyromegaly present.  Cardiovascular: Normal rate and regular rhythm.   Pulmonary/Chest: Breath sounds normal. No respiratory distress. She has no wheezes.  Abdominal: Soft. Bowel sounds are normal. There is no tenderness.  Musculoskeletal: She exhibits no edema or tenderness.  Lymphadenopathy:    She has no cervical adenopathy.  Skin: No rash noted. No erythema.  Psychiatric: She has a normal mood and affect. Her behavior is normal.    BP 120/80 mmHg  Pulse 66  Temp(Src) 98.4 F (36.9 C) (Oral)  Ht _0  (1.651 m)  Wt 139 lb 6 oz (63.22 kg)  BMI 23.19 kg/m2  SpO2 98% Wt Readings from Last 3 Encounters:  09/07/14 139 lb 6 oz (63.22 kg)  06/07/14 138 lb 4 oz (62.71 kg)  02/05/14 143 lb 8 oz (65.091 kg)     Lab Results  Component Value Date   WBC 7.7  09/07/2014   HGB 13.7 09/07/2014   HCT 41.1 09/07/2014   PLT 241.0 09/07/2014   GLUCOSE 96 09/07/2014   CHOL 164 09/07/2014   TRIG 123.0 09/07/2014   HDL 55.20 09/07/2014   LDLDIRECT 237.0 07/15/2012   LDLCALC 84 09/07/2014   ALT 16 09/07/2014   AST 20 09/07/2014   NA 141 09/07/2014   NA 141 09/07/2014   K 4.3 09/07/2014   CL 103 09/07/2014   CREATININE 0.81 09/07/2014   BUN 16 09/07/2014   CO2 32 09/07/2014   TSH 1.90 06/05/2014   HGBA1C 5.4 09/07/2014    Mm Screening Breast Tomo Bilateral  06/15/2014   CLINICAL DATA:  Screening.  EXAM: DIGITAL SCREENING BILATERAL MAMMOGRAM WITH 3D TOMO WITH CAD  COMPARISON:  Previous exam(s).  ACR Breast Density Category c: The breast tissue is heterogeneously dense, which may obscure small masses.  FINDINGS: There are no findings suspicious for malignancy. Images were processed with CAD.  IMPRESSION: No mammographic evidence of malignancy. A result letter of this screening mammogram will be mailed directly to the patient.  RECOMMENDATION: Screening mammogram in one year. (Code:[id number]-01Y)  BI-RADS CATEGORY  1: Negative.   Electronically Signed   By: Lovey Newcomer M.D.   On: 06/15/2014 13:22       Assessment & Plan:   Problem List Items Addressed This Visit    Abnormal mammogram    Mammogram 06/15/14 - Birads I.        GERD (gastroesophageal reflux disease)    On protonix.  Symptoms controlled.        Hypercholesteremia    On atorvastatin.  Follow lipid panel and liver function tests.        Relevant Orders   Lipid panel (Completed)   Hepatic function panel (Completed)   Hyperglycemia    Follow met b and a1c.        Relevant Orders   Hemoglobin A1c (Completed)   Hypertension - Primary    Blood pressure under good control.  Continue same medication regimen.  Follow pressures.  Follow metabolic panel.        Relevant Orders   Basic metabolic panel (Completed)   Memory change    Followed by Dr Manuella Ghazi.        Relevant Orders    CBC with Differential/Platelet (Completed)   Stress    Feels she is handling stress well.  Follow.        TIA (transient ischemic attack)    Work up as outlined previously.  Continues f/u with neurology.  No reoccurring episodes.  Continue aspirin.        Other Visit Diagnoses    Hyponatremia        Encounter for immunization  Einar Pheasant, MD

## 2014-09-08 ENCOUNTER — Encounter: Payer: Self-pay | Admitting: Internal Medicine

## 2014-09-08 NOTE — Assessment & Plan Note (Signed)
Feels she is handling stress well.  Follow.   

## 2014-09-08 NOTE — Assessment & Plan Note (Signed)
Mammogram 06/15/14 - Birads I.

## 2014-09-08 NOTE — Assessment & Plan Note (Signed)
On atorvastatin.  Follow lipid panel and liver function tests.   

## 2014-09-08 NOTE — Assessment & Plan Note (Signed)
Followed by Dr. Shah. 

## 2014-09-08 NOTE — Assessment & Plan Note (Signed)
Follow met b and a1c.

## 2014-09-08 NOTE — Assessment & Plan Note (Signed)
Blood pressure under good control.  Continue same medication regimen.  Follow pressures.  Follow metabolic panel.   

## 2014-09-08 NOTE — Assessment & Plan Note (Signed)
On protonix.  Symptoms controlled.   

## 2014-09-08 NOTE — Assessment & Plan Note (Signed)
Work up as outlined previously.  Continues f/u with neurology.  No reoccurring episodes.  Continue aspirin.

## 2014-09-11 ENCOUNTER — Encounter: Payer: Self-pay | Admitting: *Deleted

## 2014-10-31 ENCOUNTER — Other Ambulatory Visit: Payer: Self-pay | Admitting: Internal Medicine

## 2014-12-11 ENCOUNTER — Emergency Department: Payer: Commercial Managed Care - HMO

## 2014-12-11 ENCOUNTER — Emergency Department
Admission: EM | Admit: 2014-12-11 | Discharge: 2014-12-11 | Disposition: A | Discharge status: Home / Self Care | Payer: Commercial Managed Care - HMO | Attending: Emergency Medicine | Admitting: Emergency Medicine

## 2014-12-11 DIAGNOSIS — Z79899 Other long term (current) drug therapy: Secondary | ICD-10-CM | POA: Insufficient documentation

## 2014-12-11 DIAGNOSIS — I10 Essential (primary) hypertension: Secondary | ICD-10-CM | POA: Insufficient documentation

## 2014-12-11 DIAGNOSIS — S0003XA Contusion of scalp, initial encounter: Secondary | ICD-10-CM | POA: Insufficient documentation

## 2014-12-11 DIAGNOSIS — Z880 Allergy status to penicillin: Secondary | ICD-10-CM | POA: Insufficient documentation

## 2014-12-11 DIAGNOSIS — M545 Low back pain, unspecified: Secondary | ICD-10-CM

## 2014-12-11 DIAGNOSIS — Y9289 Other specified places as the place of occurrence of the external cause: Secondary | ICD-10-CM | POA: Insufficient documentation

## 2014-12-11 DIAGNOSIS — W01198A Fall on same level from slipping, tripping and stumbling with subsequent striking against other object, initial encounter: Secondary | ICD-10-CM | POA: Diagnosis not present

## 2014-12-11 DIAGNOSIS — S3992XA Unspecified injury of lower back, initial encounter: Secondary | ICD-10-CM | POA: Diagnosis not present

## 2014-12-11 DIAGNOSIS — S0990XA Unspecified injury of head, initial encounter: Secondary | ICD-10-CM | POA: Diagnosis not present

## 2014-12-11 DIAGNOSIS — Y9389 Activity, other specified: Secondary | ICD-10-CM | POA: Diagnosis not present

## 2014-12-11 DIAGNOSIS — Y998 Other external cause status: Secondary | ICD-10-CM | POA: Insufficient documentation

## 2014-12-11 DIAGNOSIS — R51 Headache: Secondary | ICD-10-CM | POA: Diagnosis not present

## 2014-12-11 DIAGNOSIS — Z7982 Long term (current) use of aspirin: Secondary | ICD-10-CM | POA: Diagnosis not present

## 2014-12-11 MED ORDER — NAPROXEN 500 MG PO TABS: 500.0000 mg | ORAL_TABLET | Freq: Two times a day (BID) | ORAL | Status: DC

## 2014-12-11 NOTE — Discharge Instructions (Signed)
Back Pain, Adult °Back pain is very common in adults. The cause of back pain is rarely dangerous and the pain often gets better over time. The cause of your back pain may not be known. Some common causes of back pain include: °· Strain of the muscles or ligaments supporting the spine. °· Wear and tear (degeneration) of the spinal disks. °· Arthritis. °· Direct injury to the back. °For many people, back pain may return. Since back pain is rarely dangerous, most people can learn to manage this condition on their own. °HOME CARE INSTRUCTIONS °Watch your back pain for any changes. The following actions may help to lessen any discomfort you are feeling: °· Remain active. It is stressful on your back to sit or stand in one place for long periods of time. Do not sit, drive, or stand in one place for more than 30 minutes at a time. Take short walks on even surfaces as soon as you are able. Try to increase the length of time you walk each day. °· Exercise regularly as directed by your health care provider. Exercise helps your back heal faster. It also helps avoid future injury by keeping your muscles strong and flexible. °· Do not stay in bed. Resting more than 1-2 days can delay your recovery. °· Pay attention to your body when you bend and lift. The most comfortable positions are those that put less stress on your recovering back. Always use proper lifting techniques, including: °¨ Bending your knees. °¨ Keeping the load close to your body. °¨ Avoiding twisting. °· Find a comfortable position to sleep. Use a firm mattress and lie on your side with your knees slightly bent. If you lie on your back, put a pillow under your knees. °· Avoid feeling anxious or stressed. Stress increases muscle tension and can worsen back pain. It is important to recognize when you are anxious or stressed and learn ways to manage it, such as with exercise. °· Take medicines only as directed by your health care provider. Over-the-counter  medicines to reduce pain and inflammation are often the most helpful. Your health care provider may prescribe muscle relaxant drugs. These medicines help dull your pain so you can more quickly return to your normal activities and healthy exercise. °· Apply ice to the injured area: °¨ Put ice in a plastic bag. °¨ Place a towel between your skin and the bag. °¨ Leave the ice on for 20 minutes, 2-3 times a day for the first 2-3 days. After that, ice and heat may be alternated to reduce pain and spasms. °· Maintain a healthy weight. Excess weight puts extra stress on your back and makes it difficult to maintain good posture. °SEEK MEDICAL CARE IF: °· You have pain that is not relieved with rest or medicine. °· You have increasing pain going down into the legs or buttocks. °· You have pain that does not improve in one week. °· You have night pain. °· You lose weight. °· You have a fever or chills. °SEEK IMMEDIATE MEDICAL CARE IF:  °· You develop new bowel or bladder control problems. °· You have unusual weakness or numbness in your arms or legs. °· You develop nausea or vomiting. °· You develop abdominal pain. °· You feel faint. °  °This information is not intended to replace advice given to you by your health care provider. Make sure you discuss any questions you have with your health care provider. °  °Document Released: 12/22/2004 Document Revised: 01/12/2014 Document Reviewed: 04/25/2013 °Elsevier Interactive Patient Education ©2016 Elsevier   Inc.  Contusion A contusion is a deep bruise. Contusions are the result of a blunt injury to tissues and muscle fibers under the skin. The injury causes bleeding under the skin. The skin overlying the contusion may turn blue, purple, or yellow. Minor injuries will give you a painless contusion, but more severe contusions may stay painful and swollen for a few weeks.  CAUSES  This condition is usually caused by a blow, trauma, or direct force to an area of the body. SYMPTOMS   Symptoms of this condition include:  Swelling of the injured area.  Pain and tenderness in the injured area.  Discoloration. The area may have redness and then turn blue, purple, or yellow. DIAGNOSIS  This condition is diagnosed based on a physical exam and medical history. An X-ray, CT scan, or MRI may be needed to determine if there are any associated injuries, such as broken bones (fractures). TREATMENT  Specific treatment for this condition depends on what area of the body was injured. In general, the best treatment for a contusion is resting, icing, applying pressure to (compression), and elevating the injured area. This is often called the RICE strategy. Over-the-counter anti-inflammatory medicines may also be recommended for pain control.  HOME CARE INSTRUCTIONS   Rest the injured area.  If directed, apply ice to the injured area:  Put ice in a plastic bag.  Place a towel between your skin and the bag.  Leave the ice on for 20 minutes, 2-3 times per day.  If directed, apply light compression to the injured area using an elastic bandage. Make sure the bandage is not wrapped too tightly. Remove and reapply the bandage as directed by your health care provider.  If possible, raise (elevate) the injured area above the level of your heart while you are sitting or lying down.  Take over-the-counter and prescription medicines only as told by your health care provider. SEEK MEDICAL CARE IF:  Your symptoms do not improve after several days of treatment.  Your symptoms get worse.  You have difficulty moving the injured area. SEEK IMMEDIATE MEDICAL CARE IF:   You have severe pain.  You have numbness in a hand or foot.  Your hand or foot turns pale or cold.   This information is not intended to replace advice given to you by your health care provider. Make sure you discuss any questions you have with your health care provider.   Document Released: 10/01/2004 Document Revised:  09/12/2014 Document Reviewed: 05/09/2014 Elsevier Interactive Patient Education [employer]2016 Elsevier Inc.

## 2014-12-11 NOTE — ED Notes (Signed)
Pt presents with a fall hitting the back of her head on her dresser yesterday. Pt with hx of stroke and has trouble remembering things. No loc, no laceration. Pt denies any dizziness prior to fall. Pt was changing sheets on the bed.  Pt c/o scalp tenderness, denies neck pain. C/o mid back pain upon assessment.

## 2014-12-11 NOTE — ED Notes (Addendum)
Pt to triage via w/c with no distress; reports falling yesterday while changing sheets; st hit back of head on dresser; denies LOC or HA; c/o lower back pain since; tenderness with palpation to occipitut but skin appears intact

## 2014-12-11 NOTE — ED Provider Notes (Signed)
[facility] Emergency Department Provider Note  ____________________________________________  Time seen: Approximately 7:30 AM  I have reviewed the triage vital signs and the nursing notes.   HISTORY  Chief Complaint Fall  Patient with a history of mild dementia. Patient history is provided by patient and husband.  HPI Anita Santana is a 74 y.o. female who presents to the emergency department one day status post falling and hitting her head.Per the patient she was changing sheets on the bed when she fell striking the occipital region of her head against a dresser. The patient states that she does not remember what caused her to fall or whether she lost consciousness. She is now endorsing occipital scalp pain and lower back pain.  Further details are provided by patient's husband. Per the husband the patient has a history of TIA/stroke in the past. She also has a history of mild progressive dementia. Per the husband and the patient was "normal" yesterday morning doing activities around the house. He left the house briefly for groceries when he returned he states patient had fallen and struck her head. He asked patient why she did not call him and she states "I cannot remember your number." Per the husband this is a change from baseline. He reports that patient has had decreased level of activity from normal throughout the evening yesterday. Her confusion and memory loss has increased from baseline. He reports that there is no residual function loss from previous strokes. Patient does take daily aspirin but no blood thinners.   Past Medical History  Diagnosis Date  . GERD (gastroesophageal reflux disease)   . Hypertension   . Hypercholesteremia   . TIA (transient ischemic attack)   . Syncope and collapse   . Diverticulosis of colon 02/08/12    Colonoscopy    Patient Active Problem List   Diagnosis Date Noted  . Health care maintenance 02/06/2014  .  Hyperglycemia 02/06/2014  . Stress 08/04/2012  . Abnormal mammogram 07/17/2012  . Memory change 07/17/2012  . Diverticulosis of colon without hemorrhage 02/15/2012  . Dysrhythmia, cardiac 02/09/2012  . Hypertension 11/06/2011  . GERD (gastroesophageal reflux disease) 11/06/2011  . TIA (transient ischemic attack) 11/06/2011  . Hypercholesteremia 11/05/2011    Past Surgical History  Procedure Laterality Date  . Tonsilectomy/adenoidectomy with myringotomy  1950  . Cataract surgery  2012    Current Outpatient Rx  Name  Route  Sig  Dispense  Refill  . acetaminophen (TYLENOL ARTHRITIS PAIN) 650 MG CR tablet   Oral   Take 650 mg by mouth as needed.         Marland Kitchen aspirin 81 MG tablet   Oral   Take 81 mg by mouth daily.         Marland Kitchen atorvastatin (LIPITOR) 20 MG tablet      TAKE 1 TABLET EVERY DAY   90 tablet   1   . hydrochlorothiazide (HYDRODIURIL) 25 MG tablet      TAKE 1/2 TABLET EVERY DAY   45 tablet   1   . lisinopril (PRINIVIL,ZESTRIL) 40 MG tablet      TAKE 1 TABLET EVERY DAY   90 tablet   1   . naproxen (NAPROSYN) 500 MG tablet   Oral   Take 1 tablet (500 mg total) by mouth 2 (two) times daily with a meal.   60 tablet   0   . pantoprazole (PROTONIX) 40 MG tablet      TAKE 1 TABLET EVERY DAY  90 tablet   1   . Psyllium (METAMUCIL PO)   Oral   Take by mouth daily.         . ranitidine (ZANTAC) 150 MG tablet   Oral   Take 150 mg by mouth as needed.         . simethicone (GAS-X) 80 MG chewable tablet   Oral   Chew 80 mg by mouth as needed.           Allergies Plavix and Penicillins  Family History  Problem Relation Age of Onset  . Hypercholesterolemia Mother   . Stroke Mother   . Hypertension Mother   . Cancer Mother     ovarian/uterus  . Hypercholesterolemia Father   . Heart disease Father   . Heart attack Father   . Cancer Maternal Grandmother     unsure of what type    Social History Social History  Substance Use Topics  .  Smoking status: Never Smoker   . Smokeless tobacco: Never Used  . Alcohol Use: No    Review of Systems Constitutional: No fever/chills Eyes: No visual changes. ENT: No sore throat. Cardiovascular: Denies chest pain. Respiratory: Denies shortness of breath. Gastrointestinal: No abdominal pain.  No nausea, no vomiting.  No diarrhea.  No constipation. Genitourinary: Negative for dysuria. Musculoskeletal: Endorses lumbar back pain. Skin: Negative for rash. Neurological: Endorses occipital headache but denies focal weakness or numbness. Per husband patient has a increase in confusion/memory loss from baseline.  10-point ROS otherwise negative.  ____________________________________________   PHYSICAL EXAM:  VITAL SIGNS: ED Triage Vitals  Enc Vitals Group     BP 12/11/14 0651 141/59 mmHg     Pulse Rate 12/11/14 0651 60     Resp 12/11/14 0651 18     Temp 12/11/14 0651 97.6 F (36.4 C)     Temp Source 12/11/14 0651 Oral     SpO2 12/11/14 0651 99 %     Weight 12/11/14 0651 130 lb (58.968 kg)     Height 12/11/14 0651  (1.651 m)     Head Cir --      Peak Flow --      Pain Score 12/11/14 0652 7     Pain Loc --      Pain Edu? --      Excl. in GC? --     Constitutional: Alert and oriented. Well appearing and in no acute distress. Eyes: Conjunctivae are normal. PERRL. EOMI. Head: No visible deformity to inspection. No laceration or abrasion noted to occipital region. Minor hematoma to palpation in the occipital region. Tenderness to palpation in the occipital region. No serosanguineous fluid drainage from ears or nares. Nose: No congestion/rhinnorhea. No serosanguineous drainage Mouth/Throat: Mucous membranes are moist.  Oropharynx non-erythematous. Neck: No stridor.  No cervical spine tenderness to palpation. Cardiovascular: Normal rate, regular rhythm. Grossly normal heart sounds.  Good peripheral circulation. Respiratory: Normal respiratory effort.  No retractions. Lungs  CTAB. Gastrointestinal: Soft and nontender. No distention. No abdominal bruits. No CVA tenderness. Musculoskeletal: No lower extremity tenderness nor edema.  No joint effusions. No visible deformity to the lumbar spine region. Patient is tender midline to palpation lumbar spinal processes. Diffuse tenderness to palpation over bilateral paraspinal muscle groups in lumbar region. Neurologic:  Normal speech and language. No gross focal neurologic deficits are appreciated. No gait instability. Cranial nerves II-12 grossly intact. No pronator drift. Equal strength and sensation 4 extremities. Skin:  Skin is warm, dry and intact. No rash noted.  Psychiatric: Mood and affect are normal. Speech and behavior are normal.  ____________________________________________   LABS (all labs ordered are listed, but only abnormal results are displayed)  Labs Reviewed - No data to display ____________________________________________  EKG   ____________________________________________  RADIOLOGY  Head CT Impression: Stable and negative noncontrast CT of the brain. No acute traumatic injury identified.  Lumbar spine x-ray Impression: Degenerative changes and scoliosis. No acute abnormality is seen.   All images were reviewed by myself. ____________________________________________   PROCEDURES  Procedure(s) performed: None  Critical Care performed: No  ____________________________________________   INITIAL IMPRESSION / ASSESSMENT AND PLAN / ED COURSE  Pertinent labs & imaging results that were available during my care of the patient were reviewed by me and considered in my medical decision making (see chart for details).  Patient's head CT and lumbar x-ray returned without any acute abnormality. Patient's diagnosis is consistent with occipital region scalp contusion as well as lumbago. I advised patient and her husband of findings and diagnosis and they verbalize understanding of same. The  patient does not have a history of chronic kidney disease and has not been told she can take anti-inflammatories and the patient will be placed on Naprosyn for symptomatic control. Patient may use Tylenol in addition to prescribe medications. Patient is advised not to use any other NSAIDs. Patient may use a heating pad. Patient will follow-up with primary care for any symptoms persisting past treatment course ____________________________________________   FINAL CLINICAL IMPRESSION(S) / ED DIAGNOSES  Final diagnoses:  Contusion of occipital region of scalp, initial encounter  Acute midline low back pain without sciatica      Racheal PatchesJonathan D Romaine Neville, PA-C 12/11/14 [id number]  Jene Everyobert Kinner, MD 12/11/14 1055

## 2015-01-24 ENCOUNTER — Ambulatory Visit (INDEPENDENT_AMBULATORY_CARE_PROVIDER_SITE_OTHER): Payer: Commercial Managed Care - HMO

## 2015-01-24 VITALS — BP 138/62 | HR 60 | Temp 97.1°F | Resp 14 | Ht 65.0 in | Wt 134.1 lb

## 2015-01-24 DIAGNOSIS — Z0000 Encounter for general adult medical examination without abnormal findings: Secondary | ICD-10-CM

## 2015-01-24 NOTE — Progress Notes (Signed)
Subjective:   Anita Santana is a 75 y.o. female who presents for an Initial Medicare Annual Wellness Visit.  Review of Systems    No ROS.  Medicare Wellness Visit.  Cardiac Risk Factors include: advanced age (>8men, >29 women);hypertension     Objective:    Today's Vitals   01/24/15 1253  BP: 138/62  Pulse: 60  Temp: 97.1 F (36.2 C)  TempSrc: Oral  Resp: 14  Height:  (1.651 m)  Weight: 134 lb 1.9 oz (60.836 kg)  SpO2: 98%    Current Medications (verified) Outpatient Encounter Prescriptions as of 01/24/2015  Medication Sig  . aspirin 81 MG tablet Take 81 mg by mouth daily.  Marland Kitchen atorvastatin (LIPITOR) 20 MG tablet TAKE 1 TABLET EVERY DAY  . hydrochlorothiazide (HYDRODIURIL) 25 MG tablet TAKE 1/2 TABLET EVERY DAY  . lisinopril (PRINIVIL,ZESTRIL) 40 MG tablet TAKE 1 TABLET EVERY DAY  . ranitidine (ZANTAC) 150 MG tablet Take 150 mg by mouth as needed.  . simethicone (GAS-X) 80 MG chewable tablet Chew 80 mg by mouth as needed.  . [DISCONTINUED] pantoprazole (PROTONIX) 40 MG tablet TAKE 1 TABLET EVERY DAY  . acetaminophen (TYLENOL ARTHRITIS PAIN) 650 MG CR tablet Take 650 mg by mouth as needed. Reported on 01/24/2015  . naproxen (NAPROSYN) 500 MG tablet Take 1 tablet (500 mg total) by mouth 2 (two) times daily with a meal. (Patient not taking: Reported on 01/24/2015)  . Psyllium (METAMUCIL PO) Take by mouth daily. Reported on 01/24/2015   No facility-administered encounter medications on file as of 01/24/2015.    Allergies (verified) Plavix and Penicillins   History: Past Medical History  Diagnosis Date  . GERD (gastroesophageal reflux disease)   . Hypertension   . Hypercholesteremia   . TIA (transient ischemic attack)   . Syncope and collapse   . Diverticulosis of colon 02/08/12    Colonoscopy   Past Surgical History  Procedure Laterality Date  . Tonsilectomy/adenoidectomy with myringotomy  1950  . Cataract surgery  2012   Family History  Problem Relation Age  of Onset  . Hypercholesterolemia Mother   . Stroke Mother   . Hypertension Mother   . Cancer Mother     ovarian/uterus  . Hypercholesterolemia Father   . Heart disease Father   . Heart attack Father   . Cancer Maternal Grandmother     unsure of what type   Social History   Occupational History  . Not on file.   Social History Main Topics  . Smoking status: Never Smoker   . Smokeless tobacco: Never Used  . Alcohol Use: No  . Drug Use: No  . Sexual Activity: Not Currently    Tobacco Counseling Counseling given: Not Answered   Activities of Daily Living In your present state of health, do you have any difficulty performing the following activities: 01/24/2015  Hearing? N  Vision? N  Difficulty concentrating or making decisions? N  Walking or climbing stairs? N  Dressing or bathing? N  Doing errands, shopping? N  Preparing Food and eating ? N  Using the Toilet? N  In the past six months, have you accidently leaked urine? N  Do you have problems with loss of bowel control? N  Managing your Medications? N  Managing your Finances? N  Housekeeping or managing your Housekeeping? N    Immunizations and Health Maintenance Immunization History  Administered Date(s) Administered  . Influenza,inj,Quad PF,36+ Mos 09/07/2012, 09/06/2013, 09/07/2014  . Pneumococcal Conjugate-13 02/05/2014   Health  Maintenance Due  Topic Date Due  . TETANUS/TDAP  11/03/1959  . ZOSTAVAX  11/02/2000  . DEXA SCAN  11/02/2005    Patient Care Team: Dale [place], MD as PCP - General (Internal Medicine)  Indicate any recent Medical Services you may have received from other than [facility] providers in the past year (date may be approximate).     Assessment:   This is a routine wellness examination for Anita Santana. The goal of the wellness visit is to assist the patient how to close the gaps in care and create a preventative care plan for the patient.   Osteoporosis risk reviewed.   Medications  reviewed; taking without issues or barriers.   Safety issues reviewed; uncertain if smoke detectors in the home. Encouraged to follow up and install if needed.  Firearms locked in a secure area in the home. Wears seatbelts when driving or riding with others. No violence in the home.   No identified risk were noted; The patient was oriented x 3; appropriate in dress and manner and no objective failures at ADL's or IADL's.   TDAP and ZOSTAVAX vaccine postponed for follow up with insurance, per patient request.  DEXA Scan postponed per patient request.  Educational material provided.  Follow up with PCP.  Patient Concerns:  None at this time.  Follow up with PCP as needed.  Hearing/Vision screen Hearing Screening Comments: Passes the whisper test Vision Screening Comments: Followed by [facility] Last OV 06/2014 Wears glasses  Dietary issues and exercise activities discussed: Current Exercise Habits:: The patient does not participate in regular exercise at present  Goals    . Healthy Lifestyle     Increase water intake by 1 bottle= 2cups Make healthy choices when dining out.  Education provided. Start walking the dog around the block 1 time a day      Depression Screen PHQ 2/9 Scores 01/24/2015 06/07/2014 06/05/2013 03/15/2012 11/06/2011  PHQ - 2 Score 0 0 0 0 1    Fall Risk Fall Risk  01/24/2015 06/07/2014 06/05/2013 03/15/2012  Falls in the past year? Yes Yes No No  Number falls in past yr: 1 1 - -  Injury with Fall? Yes No - -  Follow up Education provided;Falls prevention discussed - - -    Cognitive Function: MMSE - Mini Mental State Exam 01/24/2015  Orientation to time 5  Orientation to Place 5  Registration 3  Attention/ Calculation 5  Recall 3  Language- name 2 objects 2  Language- repeat 1  Language- follow 3 step command 3  Language- read & follow direction 1  Write a sentence 1  Copy design 1  Total score 30    Screening Tests Health Maintenance  Topic  Date Due  . TETANUS/TDAP  11/03/1959  . ZOSTAVAX  11/02/2000  . DEXA SCAN  11/02/2005  . PNA vac Low Risk Adult (2 of 2 - PPSV23) 02/06/2015  . MAMMOGRAM  06/14/2015  . INFLUENZA VACCINE  08/06/2015  . COLONOSCOPY  02/07/2022      Plan:    End of life planning; Advance aging; Advanced directives discussed. Copy requested of current HCPOA/Living Will.    During the course of the visit, Terrah was educated and counseled about the following appropriate screening and preventive services:   Vaccines to include Pneumoccal, Influenza, Hepatitis B, Td, Zostavax, HCV  Electrocardiogram  Cardiovascular disease screening  Colorectal cancer screening  Bone density screening  Diabetes screening  Glaucoma screening  Mammography/PAP  Nutrition counseling  Smoking  cessation counseling  Patient Instructions (the written plan) were given to the patient.    Ashok Pall, LPN   06/09/5407    Reviewed above information.  Agree with plan.   Dr Lorin Picket

## 2015-01-24 NOTE — Patient Instructions (Addendum)
Anita Santana,  Thank you for taking time to come for your Medicare Wellness Visit.  I appreciate your ongoing commitment to your health goals. Please review the following plan we discussed and let me know if I can assist you in the future.  Return in June for physical with Dr. Lorin Picket  Follow up with PCP as needed  Bone Densitometry Bone densitometry is an imaging test that uses a special X-ray to measure the amount of calcium and other minerals in your bones (bone density). This test is also known as a bone mineral density test or dual-energy X-ray absorptiometry (DXA). The test can measure bone density at your hip and your spine. It is similar to having a regular X-ray. You may have this test to:  Diagnose a condition that causes weak or thin bones (osteoporosis).  Predict your risk of a broken bone (fracture).  Determine how well osteoporosis treatment is working. LET [facility] CARE PROVIDER KNOW ABOUT:  Any allergies you have.  All medicines you are taking, including vitamins, herbs, eye drops, creams, and over-the-counter medicines.  Previous problems you or members of your family have had with the use of anesthetics.  Any blood disorders you have.  Previous surgeries you have had.  Medical conditions you have.  Possibility of pregnancy.  Any other medical test you had within the previous 14 days that used contrast material. RISKS AND COMPLICATIONS Generally, this is a safe procedure. However, problems can occur and may include the following:  This test exposes you to a very small amount of radiation.  The risks of radiation exposure may be greater to unborn children. BEFORE THE PROCEDURE  Do not take any calcium supplements for 24 hours before having the test. You can otherwise eat and drink what you usually do.  Take off all metal jewelry, eyeglasses, dental appliances, and any other metal objects. PROCEDURE  You may lie on an exam table. There will be an X-ray  generator below you and an imaging device above you.  Other devices, such as boxes or braces, may be used to position your body properly for the scan.  You will need to lie still while the machine slowly scans your body.  The images will show up on a computer monitor. AFTER THE PROCEDURE You may need more testing at a later time.   This information is not intended to replace advice given to you by your health care provider. Make sure you discuss any questions you have with your health care provider.   Document Released: 01/14/2004 Document Revised: 01/12/2014 Document Reviewed: 06/01/2013 Elsevier Interactive Patient Education [employer]

## 2015-01-30 DIAGNOSIS — G301 Alzheimer's disease with late onset: Secondary | ICD-10-CM | POA: Diagnosis not present

## 2015-01-30 DIAGNOSIS — F0280 Dementia in other diseases classified elsewhere without behavioral disturbance: Secondary | ICD-10-CM | POA: Diagnosis not present

## 2015-04-18 ENCOUNTER — Emergency Department: Payer: Commercial Managed Care - HMO

## 2015-04-18 ENCOUNTER — Emergency Department
Admission: EM | Admit: 2015-04-18 | Discharge: 2015-04-18 | Disposition: A | Discharge status: Home / Self Care | Payer: Commercial Managed Care - HMO | Attending: Emergency Medicine | Admitting: Emergency Medicine

## 2015-04-18 DIAGNOSIS — R55 Syncope and collapse: Secondary | ICD-10-CM | POA: Diagnosis not present

## 2015-04-18 DIAGNOSIS — Z79899 Other long term (current) drug therapy: Secondary | ICD-10-CM | POA: Diagnosis not present

## 2015-04-18 DIAGNOSIS — E7800 Pure hypercholesterolemia, unspecified: Secondary | ICD-10-CM | POA: Insufficient documentation

## 2015-04-18 DIAGNOSIS — K5730 Diverticulosis of large intestine without perforation or abscess without bleeding: Secondary | ICD-10-CM | POA: Insufficient documentation

## 2015-04-18 DIAGNOSIS — K5720 Diverticulitis of large intestine with perforation and abscess without bleeding: Secondary | ICD-10-CM | POA: Diagnosis not present

## 2015-04-18 DIAGNOSIS — I10 Essential (primary) hypertension: Secondary | ICD-10-CM | POA: Insufficient documentation

## 2015-04-18 DIAGNOSIS — R1032 Left lower quadrant pain: Secondary | ICD-10-CM | POA: Diagnosis not present

## 2015-04-18 DIAGNOSIS — G459 Transient cerebral ischemic attack, unspecified: Secondary | ICD-10-CM | POA: Insufficient documentation

## 2015-04-18 DIAGNOSIS — K219 Gastro-esophageal reflux disease without esophagitis: Secondary | ICD-10-CM | POA: Insufficient documentation

## 2015-04-18 DIAGNOSIS — Z7982 Long term (current) use of aspirin: Secondary | ICD-10-CM | POA: Insufficient documentation

## 2015-04-18 LAB — CBC
HEMATOCRIT: 37.1 % (ref 35.0–47.0)
Hemoglobin: 12.8 g/dL (ref 12.0–16.0)
MCH: 30.6 pg (ref 26.0–34.0)
MCHC: 34.6 g/dL (ref 32.0–36.0)
MCV: 88.5 fL (ref 80.0–100.0)
Platelets: 288 10*3/uL (ref 150–440)
RBC: 4.2 MIL/uL (ref 3.80–5.20)
RDW: 14.3 % (ref 11.5–14.5)
WBC: 6.5 10*3/uL (ref 3.6–11.0)

## 2015-04-18 LAB — COMPREHENSIVE METABOLIC PANEL
ALBUMIN: 3.8 g/dL (ref 3.5–5.0)
ALK PHOS: 90 U/L (ref 38–126)
ALT: 17 U/L (ref 14–54)
AST: 26 U/L (ref 15–41)
Anion gap: 7 (ref 5–15)
BILIRUBIN TOTAL: 1.1 mg/dL (ref 0.3–1.2)
BUN: 10 mg/dL (ref 6–20)
CALCIUM: 9.2 mg/dL (ref 8.9–10.3)
CO2: 26 mmol/L (ref 22–32)
CREATININE: 0.8 mg/dL (ref 0.44–1.00)
Chloride: 106 mmol/L (ref 101–111)
GFR calc Af Amer: >60 mL/min (ref >60)
GFR calc non Af Amer: >60 mL/min (ref >60)
GLUCOSE: 99 mg/dL (ref 65–99)
Potassium: 4.2 mmol/L (ref 3.5–5.1)
Sodium: 139 mmol/L (ref 135–145)
TOTAL PROTEIN: 7.3 g/dL (ref 6.5–8.1)

## 2015-04-18 LAB — LIPASE, BLOOD: Lipase: 16 U/L (ref 11–51)

## 2015-04-18 MED ORDER — ONDANSETRON HCL 4 MG/2ML IJ SOLN: 4.0000 mg | Freq: Once | INTRAMUSCULAR | Status: DC

## 2015-04-18 MED ORDER — IOPAMIDOL (ISOVUE-300) INJECTION 61%
100.0000 mL | Freq: Once | INTRAVENOUS | Status: AC | PRN
  Administered 2015-04-18: 100 mL via INTRAVENOUS

## 2015-04-18 MED ORDER — METRONIDAZOLE 500 MG PO TABS: 500.0000 mg | ORAL_TABLET | Freq: Three times a day (TID) | ORAL | Status: DC

## 2015-04-18 MED ORDER — CIPROFLOXACIN IN D5W 400 MG/200ML IV SOLN
400.0000 mg | Freq: Once | INTRAVENOUS | Status: AC
  Administered 2015-04-18: 400 mg via INTRAVENOUS
  Filled 2015-04-18: qty 200

## 2015-04-18 MED ORDER — MORPHINE SULFATE (PF) 2 MG/ML IV SOLN
2.0000 mg | Freq: Once | INTRAVENOUS | Status: AC
  Administered 2015-04-18: 2 mg via INTRAMUSCULAR
  Filled 2015-04-18: qty 1

## 2015-04-18 MED ORDER — DIATRIZOATE MEGLUMINE & SODIUM 66-10 % PO SOLN
15.0000 mL | Freq: Once | ORAL | Status: AC
  Administered 2015-04-18: 15 mL via ORAL
  Filled 2015-04-18: qty 30

## 2015-04-18 MED ORDER — MORPHINE SULFATE (PF) 2 MG/ML IV SOLN: 2.0000 mg | Freq: Once | INTRAVENOUS | Status: DC

## 2015-04-18 MED ORDER — ONDANSETRON 4 MG PO TBDP: 4.0000 mg | ORAL_TABLET | Freq: Three times a day (TID) | ORAL | Status: DC | PRN

## 2015-04-18 MED ORDER — CIPROFLOXACIN HCL 500 MG PO TABS
500.0000 mg | ORAL_TABLET | Freq: Two times a day (BID) | ORAL | Status: DC
Start: 2015-04-18 — End: 2015-05-01

## 2015-04-18 MED ORDER — MORPHINE SULFATE (PF) 4 MG/ML IV SOLN: 4.0000 mg | Freq: Once | INTRAVENOUS | Status: DC

## 2015-04-18 MED ORDER — METRONIDAZOLE IN NACL 5-0.79 MG/ML-% IV SOLN
500.0000 mg | Freq: Once | INTRAVENOUS | Status: AC
  Administered 2015-04-18: 500 mg via INTRAVENOUS
  Filled 2015-04-18: qty 100

## 2015-04-18 MED ORDER — OXYCODONE-ACETAMINOPHEN 5-325 MG PO TABS: 1.0000 | ORAL_TABLET | Freq: Four times a day (QID) | ORAL | Status: DC | PRN

## 2015-04-18 NOTE — Discharge Instructions (Signed)
You were prescribed a medication that is potentially sedating. Do not drink alcohol, drive or participate in any other potentially dangerous activities while taking this medication as it may make you sleepy. Do not take this medication with any other sedating medications, either prescription or over-the-counter. If you were prescribed Percocet or Vicodin, do not take these with acetaminophen (Tylenol) as it is already contained within these medications. °  °Opioid pain medications (or "narcotics") can be habit forming.  Use it as little as possible to achieve adequate pain control.  Do not use or use it with extreme caution if you have a history of opiate abuse or dependence.  If you are on a pain contract with your primary care doctor or a pain specialist, be sure to let them know you were prescribed this medication today from the [facility].  This medication is intended for your use only - do not give any to anyone else and keep it in a secure place where nobody else, especially children and pets, have access to it.  It will also cause or worsen constipation, so you may want to consider taking an over-the-counter stool softener while you are taking this medication. ° °Diverticulitis °Diverticulitis is inflammation or infection of small pouches in your colon that form when you have a condition called diverticulosis. The pouches in your colon are called diverticula. Your colon, or large intestine, is where water is absorbed and stool is formed. °Complications of diverticulitis can include: °· Bleeding. °· Severe infection. °· Severe pain. °· Perforation of your colon. °· Obstruction of your colon. °CAUSES  °Diverticulitis is caused by bacteria. °Diverticulitis happens when stool becomes trapped in diverticula. This allows bacteria to grow in the diverticula, which can lead to inflammation and infection. °RISK FACTORS °People with diverticulosis are at risk for diverticulitis. Eating a  diet that does not include enough fiber from fruits and vegetables may make diverticulitis more likely to develop. °SYMPTOMS  °Symptoms of diverticulitis may include: °· Abdominal pain and tenderness. The pain is normally located on the left side of the abdomen, but may occur in other areas. °· Fever and chills. °· Bloating. °· Cramping. °· Nausea. °· Vomiting. °· Constipation. °· Diarrhea. °· Blood in your stool. °DIAGNOSIS  °Your health care provider will ask you about your medical history and do a physical exam. You may need to have tests done because many medical conditions can cause the same symptoms as diverticulitis. Tests may include: °· Blood tests. °· Urine tests. °· Imaging tests of the abdomen, including X-rays and CT scans. °When your condition is under control, your health care provider may recommend that you have a colonoscopy. A colonoscopy can show how severe your diverticula are and whether something else is causing your symptoms. °TREATMENT  °Most cases of diverticulitis are mild and can be treated at home. Treatment may include: °· Taking over-the-counter pain medicines. °· Following a clear liquid diet. °· Taking antibiotic medicines by mouth for 7-10 days. °More severe cases may be treated at a hospital. Treatment may include: °· Not eating or drinking. °· Taking prescription pain medicine. °· Receiving antibiotic medicines through an IV tube. °· Receiving fluids and nutrition through an IV tube. °· Surgery. °HOME CARE INSTRUCTIONS  °· Follow your health care provider's instructions carefully. °· Follow a full liquid diet or other diet as directed by your health care provider. After your symptoms improve, your health care provider may tell you to change your diet. He or she may   recommend you eat a high-fiber diet. Fruits and vegetables are good sources of fiber. Fiber makes it easier to pass stool. °· Take fiber supplements or probiotics as directed by your health care provider. °· Only take  medicines as directed by your health care provider. °· Keep all your follow-up appointments. °SEEK MEDICAL CARE IF:  °· Your pain does not improve. °· You have a hard time eating food. °· Your bowel movements do not return to normal. °SEEK IMMEDIATE MEDICAL CARE IF:  °· Your pain becomes worse. °· Your symptoms do not get better. °· Your symptoms suddenly get worse. °· You have a fever. °· You have repeated vomiting. °· You have bloody or black, tarry stools. °MAKE SURE YOU:  °· Understand these instructions. °· Will watch your condition. °· Will get help right away if you are not doing well or get worse. °  °This information is not intended to replace advice given to you by your health care provider. Make sure you discuss any questions you have with your health care provider. °  °Document Released: 10/01/2004 Document Revised: 12/27/2012 Document Reviewed: 11/16/2012 °Elsevier Interactive Patient Education ©2016 Elsevier Inc. ° °

## 2015-04-18 NOTE — ED Provider Notes (Signed)
[facility] Emergency Department Provider Note  ____________________________________________  Time seen: 8:35 AM  I have reviewed the triage vital signs and the nursing notes.   HISTORY  Chief Complaint Abdominal Pain    HPI Anita Santana is a 75 y.o. female who complains of gradual onset constant left lower quadrant abdominal pain for the past 5 days. No nausea vomiting or diarrhea. Has been able to eat and drink normally. History of diverticulitis and this feels the same. Nonradiating. Worse with standing and moving, better with lying down and lying still. Moderate intensity.     Past Medical History  Diagnosis Date  . GERD (gastroesophageal reflux disease)   . Hypertension   . Hypercholesteremia   . TIA (transient ischemic attack)   . Syncope and collapse   . Diverticulosis of colon 02/08/12    Colonoscopy     Patient Active Problem List   Diagnosis Date Noted  . Health care maintenance 02/06/2014  . Hyperglycemia 02/06/2014  . Stress 08/04/2012  . Abnormal mammogram 07/17/2012  . Memory change 07/17/2012  . Diverticulosis of colon without hemorrhage 02/15/2012  . Dysrhythmia, cardiac 02/09/2012  . Hypertension 11/06/2011  . GERD (gastroesophageal reflux disease) 11/06/2011  . TIA (transient ischemic attack) 11/06/2011  . Hypercholesteremia 11/05/2011     Past Surgical History  Procedure Laterality Date  . Tonsilectomy/adenoidectomy with myringotomy  1950  . Cataract surgery  2012     Current Outpatient Rx  Name  Route  Sig  Dispense  Refill  . acetaminophen (TYLENOL ARTHRITIS PAIN) 650 MG CR tablet   Oral   Take 650 mg by mouth as needed. Reported on 01/24/2015         . aspirin 81 MG tablet   Oral   Take 81 mg by mouth daily.         Marland Kitchen. atorvastatin (LIPITOR) 20 MG tablet      TAKE 1 TABLET EVERY DAY   90 tablet   1   . ciprofloxacin (CIPRO) 500 MG tablet   Oral   Take 1 tablet (500 mg total) by mouth 2 (two)  times daily.   20 tablet   0   . hydrochlorothiazide (HYDRODIURIL) 25 MG tablet      TAKE 1/2 TABLET EVERY DAY   45 tablet   1   . lisinopril (PRINIVIL,ZESTRIL) 40 MG tablet      TAKE 1 TABLET EVERY DAY   90 tablet   1   . metroNIDAZOLE (FLAGYL) 500 MG tablet   Oral   Take 1 tablet (500 mg total) by mouth 3 (three) times daily.   28 tablet   0   . naproxen (NAPROSYN) 500 MG tablet   Oral   Take 1 tablet (500 mg total) by mouth 2 (two) times daily with a meal. Patient not taking: Reported on 01/24/2015   60 tablet   0   . ondansetron (ZOFRAN ODT) 4 MG disintegrating tablet   Oral   Take 1 tablet (4 mg total) by mouth every 8 (eight) hours as needed for nausea or vomiting.   20 tablet   0   . oxyCODONE-acetaminophen (ROXICET) 5-325 MG tablet   Oral   Take 1 tablet by mouth every 6 (six) hours as needed for severe pain.   12 tablet   0   . Psyllium (METAMUCIL PO)   Oral   Take by mouth daily. Reported on 01/24/2015         . ranitidine (ZANTAC) 150  MG tablet   Oral   Take 150 mg by mouth as needed.         . simethicone (GAS-X) 80 MG chewable tablet   Oral   Chew 80 mg by mouth as needed.            Allergies Plavix and Penicillins   Family History  Problem Relation Age of Onset  . Hypercholesterolemia Mother   . Stroke Mother   . Hypertension Mother   . Cancer Mother     ovarian/uterus  . Hypercholesterolemia Father   . Heart disease Father   . Heart attack Father   . Cancer Maternal Grandmother     unsure of what type    Social History Social History  Substance Use Topics  . Smoking status: Never Smoker   . Smokeless tobacco: Never Used  . Alcohol Use: No    Review of Systems  Constitutional:   No fever or chills.  Eyes:   No vision changes.  ENT:   No sore throat. No rhinorrhea. Cardiovascular:   No chest pain. Respiratory:   No dyspnea or cough. Gastrointestinal:   Positive abdominal pain as above without vomiting and  diarrhea.  No bloody stool. Genitourinary:   Negative for dysuria or difficulty urinating. Musculoskeletal:   Negative for focal pain or swelling Neurological:   Negative for headaches 10-point ROS otherwise negative.  ____________________________________________   PHYSICAL EXAM:  VITAL SIGNS: ED Triage Vitals  Enc Vitals Group     BP 04/18/15 0752 124/51 mmHg     Pulse Rate 04/18/15 0752 70     Resp --      Temp 04/18/15 0752 98.1 F (36.7 C)     Temp Source 04/18/15 0752 Oral     SpO2 04/18/15 0752 99 %     Weight 04/18/15 0752 139 lb (63.05 kg)     Height 04/18/15 0752  (1.676 m)     Head Cir --      Peak Flow --      Pain Score 04/18/15 0753 8     Pain Loc --      Pain Edu? --      Excl. in GC? --     Vital signs reviewed, nursing assessments reviewed.   Constitutional:   Alert and oriented. Well appearing and in no distress. Eyes:   No scleral icterus. No conjunctival pallor. PERRL. EOMI ENT   Head:   Normocephalic and atraumatic.   Nose:   No congestion/rhinnorhea. No septal hematoma   Mouth/Throat:   MMM, no pharyngeal erythema. No peritonsillar mass.    Neck:   No stridor. No SubQ emphysema. No meningismus. Hematological/Lymphatic/Immunilogical:   No cervical lymphadenopathy. Cardiovascular:   RRR. Symmetric bilateral radial and DP pulses.  No murmurs.  Respiratory:   Normal respiratory effort without tachypnea nor retractions. Breath sounds are clear and equal bilaterally. No wheezes/rales/rhonchi. Gastrointestinal:   Soft With focal left lower quadrant tenderness.. Non distended. There is no CVA tenderness.  No rebound, rigidity, or guarding. Genitourinary:   deferred Musculoskeletal:   Nontender with normal range of motion in all extremities. No joint effusions.  No lower extremity tenderness.  No edema. Neurologic:   Normal speech and language.  CN 2-10 normal. Motor grossly intact. No gross focal neurologic deficits are appreciated.   Skin:    Skin is warm, dry and intact. No rash noted.  No petechiae, purpura, or bullae.  ____________________________________________    LABS (pertinent positives/negatives) (all labs ordered are  listed, but only abnormal results are displayed) Labs Reviewed  LIPASE, BLOOD  COMPREHENSIVE METABOLIC PANEL  CBC  URINALYSIS COMPLETEWITH MICROSCOPIC ([facility] ONLY)   ____________________________________________   EKG  Interpreted by me  Date: 04/18/2015  Rate: 55  Rhythm: normal sinus rhythm  QRS Axis: normal  Intervals: normal  ST/T Wave abnormalities: normal  Conduction Disutrbances: none  Narrative Interpretation: unremarkable      ____________________________________________    RADIOLOGY  CT abdomen and pelvis reveals descending colon diverticulitis with inflammatory changes, 1.6 cm diverticular abscess without perforation.  ____________________________________________   PROCEDURES   ____________________________________________   INITIAL IMPRESSION / ASSESSMENT AND PLAN / ED COURSE  Pertinent labs & imaging results that were available during my care of the patient were reviewed by me and considered in my medical decision making (see chart for details).  Patient presents with focal tenderness but without peritonitis signs, concerning for diverticulitis flare. Vital signs are normal. Due to age we proceeded with CT scan of the abdomen and pelvis which found a small diverticular abscess and confirm diverticulitis in the area. Vital signs remained normal. Labs are unremarkable. Patient is tolerating oral intake. I offered the patient admission due to the complication with a small abscess, but she refuses and wishes to try treating this on an outpatient basis. I advised her that she'll need to follow up closely with her primary care doctor in the next day or 2, take Cipro and Flagyl at home, and related careful return precautions. Patient is in agreement and will return  to the ER for further evaluation and likely admission if her symptoms worsen including fever or dizziness syncope vomiting or worsening pain. I'll have her stick to a liquid diet for the next 3-5 days. She does not appear to be septic or have peritonitis at this time.     ____________________________________________   FINAL CLINICAL IMPRESSION(S) / ED DIAGNOSES  Final diagnoses:  Diverticulitis of large intestine with abscess without bleeding       Portions of this note were generated with dragon dictation software. Dictation errors may occur despite best attempts at proofreading.   Sharman Cheek, MD 04/18/15 1257

## 2015-04-18 NOTE — ED Notes (Signed)
Pt with left lower abdominal pain since Saturday. Denies changes in voiding or bowel habits. Hx diverticulitis.

## 2015-04-18 NOTE — Progress Notes (Signed)
Pastoral Care and prayer provided for patient and family. °

## 2015-04-22 ENCOUNTER — Telehealth: Payer: Self-pay | Admitting: Internal Medicine

## 2015-04-22 NOTE — Telephone Encounter (Signed)
Needs ED follow up, please advise?

## 2015-04-22 NOTE — Telephone Encounter (Signed)
Pt was seen in the ED on 04/18/2015 for Diverticulitis and needs a follow up appt. No appt avail to sch. Only wants to see Dr Lorin PicketScott.  Call husband @ [phone] or [phone]. Thank you!

## 2015-04-23 NOTE — Telephone Encounter (Signed)
I can work her in at 1:00 on Friday 04/26/15.

## 2015-04-23 NOTE — Telephone Encounter (Signed)
Need to know how Anita Santana is doing.  Will need to talk to Mr Anita Santana.  Is she better?  Pain?  Eating?  Bowels moving?, etc.

## 2015-04-23 NOTE — Telephone Encounter (Signed)
Spoke with the patient, felt better today, taking the medications they prescribed, no pain, eating okay  and moving bowels without difficulty.  She is available anytime to come except for Thursday as her husband has appts that day.

## 2015-04-23 NOTE — Telephone Encounter (Signed)
SPoke with patient and scheduled for Friday. Thanks

## 2015-04-26 ENCOUNTER — Emergency Department: Payer: Commercial Managed Care - HMO

## 2015-04-26 ENCOUNTER — Emergency Department
Admission: EM | Admit: 2015-04-26 | Discharge: 2015-04-26 | Disposition: A | Discharge status: Home / Self Care | Payer: Commercial Managed Care - HMO | Attending: Emergency Medicine | Admitting: Emergency Medicine

## 2015-04-26 ENCOUNTER — Encounter: Payer: Self-pay | Admitting: Internal Medicine

## 2015-04-26 ENCOUNTER — Ambulatory Visit (INDEPENDENT_AMBULATORY_CARE_PROVIDER_SITE_OTHER): Payer: Commercial Managed Care - HMO | Admitting: Internal Medicine

## 2015-04-26 ENCOUNTER — Encounter: Payer: Self-pay | Admitting: Emergency Medicine

## 2015-04-26 VITALS — BP 122/70 | HR 75 | Temp 98.5°F | Resp 18 | Ht 65.0 in | Wt 132.2 lb

## 2015-04-26 DIAGNOSIS — I10 Essential (primary) hypertension: Secondary | ICD-10-CM | POA: Diagnosis not present

## 2015-04-26 DIAGNOSIS — R1032 Left lower quadrant pain: Secondary | ICD-10-CM

## 2015-04-26 DIAGNOSIS — Z7982 Long term (current) use of aspirin: Secondary | ICD-10-CM | POA: Insufficient documentation

## 2015-04-26 DIAGNOSIS — Z791 Long term (current) use of non-steroidal anti-inflammatories (NSAID): Secondary | ICD-10-CM | POA: Insufficient documentation

## 2015-04-26 DIAGNOSIS — K5732 Diverticulitis of large intestine without perforation or abscess without bleeding: Secondary | ICD-10-CM | POA: Diagnosis not present

## 2015-04-26 DIAGNOSIS — Z8673 Personal history of transient ischemic attack (TIA), and cerebral infarction without residual deficits: Secondary | ICD-10-CM | POA: Diagnosis not present

## 2015-04-26 DIAGNOSIS — Z79899 Other long term (current) drug therapy: Secondary | ICD-10-CM | POA: Diagnosis not present

## 2015-04-26 LAB — COMPREHENSIVE METABOLIC PANEL
ALBUMIN: 4.2 g/dL (ref 3.5–5.0)
ALT: 27 U/L (ref 14–54)
ANION GAP: 8 (ref 5–15)
AST: 28 U/L (ref 15–41)
Alkaline Phosphatase: 79 U/L (ref 38–126)
BUN: 13 mg/dL (ref 6–20)
CHLORIDE: 103 mmol/L (ref 101–111)
CO2: 26 mmol/L (ref 22–32)
Calcium: 9 mg/dL (ref 8.9–10.3)
Creatinine, Ser: 0.94 mg/dL (ref 0.44–1.00)
GFR calc Af Amer: >60 mL/min (ref >60)
GFR calc non Af Amer: 58 mL/min — ABNORMAL LOW (ref >60)
GLUCOSE: 101 mg/dL — AB (ref 65–99)
Potassium: 3.7 mmol/L (ref 3.5–5.1)
SODIUM: 137 mmol/L (ref 135–145)
Total Bilirubin: 0.5 mg/dL (ref 0.3–1.2)
Total Protein: 7 g/dL (ref 6.5–8.1)

## 2015-04-26 LAB — URINALYSIS COMPLETE WITH MICROSCOPIC (ARMC ONLY)
BACTERIA UA: NONE SEEN
Bilirubin Urine: NEGATIVE
Glucose, UA: NEGATIVE mg/dL
HGB URINE DIPSTICK: NEGATIVE
Ketones, ur: NEGATIVE mg/dL
NITRITE: NEGATIVE
Protein, ur: NEGATIVE mg/dL
Specific Gravity, Urine: 1.015 (ref 1.005–1.030)
pH: 6 (ref 5.0–8.0)

## 2015-04-26 LAB — CBC
HEMATOCRIT: 37.7 % (ref 35.0–47.0)
HEMOGLOBIN: 12.9 g/dL (ref 12.0–16.0)
MCH: 30.3 pg (ref 26.0–34.0)
MCHC: 34.3 g/dL (ref 32.0–36.0)
MCV: 88.3 fL (ref 80.0–100.0)
Platelets: 293 10*3/uL (ref 150–440)
RBC: 4.26 MIL/uL (ref 3.80–5.20)
RDW: 14.2 % (ref 11.5–14.5)
WBC: 9.8 10*3/uL (ref 3.6–11.0)

## 2015-04-26 LAB — LIPASE, BLOOD: Lipase: 17 U/L (ref 11–51)

## 2015-04-26 MED ORDER — DIATRIZOATE MEGLUMINE & SODIUM 66-10 % PO SOLN
30.0000 mL | Freq: Once | ORAL | Status: AC
  Administered 2015-04-26: 30 mL via ORAL
  Filled 2015-04-26: qty 30

## 2015-04-26 MED ORDER — IOPAMIDOL (ISOVUE-300) INJECTION 61%
100.0000 mL | Freq: Once | INTRAVENOUS | Status: AC | PRN
  Administered 2015-04-26: 100 mL via INTRAVENOUS

## 2015-04-26 MED ORDER — ONDANSETRON HCL 4 MG/2ML IJ SOLN
4.0000 mg | Freq: Once | INTRAMUSCULAR | Status: AC
  Administered 2015-04-26: 4 mg via INTRAVENOUS
  Filled 2015-04-26: qty 2

## 2015-04-26 MED ORDER — MORPHINE SULFATE (PF) 2 MG/ML IV SOLN
2.0000 mg | Freq: Once | INTRAVENOUS | Status: AC
  Administered 2015-04-26: 2 mg via INTRAVENOUS
  Filled 2015-04-26: qty 1

## 2015-04-26 MED ORDER — DICYCLOMINE HCL 20 MG PO TABS: 20.0000 mg | ORAL_TABLET | Freq: Three times a day (TID) | ORAL | Status: DC | PRN

## 2015-04-26 NOTE — ED Notes (Signed)
Patient sent by her PCP for repeat CT of the abd. Patient seen in out ED last week and diagnosed with Diverticulitis. Patient currently c/o LLQ abd pain and diarrhea. Patient denies nausea and vomiting.

## 2015-04-26 NOTE — Progress Notes (Signed)
Patient ID: Anita Santana, female   DOB: 12-Mar-1940, 75 y.o.   MRN: [id number]   Subjective:    Patient ID: Anita Santana, female    DOB: 04/30/1940, 75 y.o.   MRN: [id number]  HPI  Patient here for an ER follow up.  She was seen on 04/18/15 for LLQ pain.  Was diagnosed with diverticulitis.  There was also note of abscess on scan.  Pt was treated with cipro and flagyl.  She has been taking regularly.  She has been able to eat.  She reports she is still with increased pain.  Actually feels pain is worse.  Unclear of bowel movements.  She states sometimes loose and sometimes hard stools - over this last week.  States had minimal bowel movement today.     Past Medical History  Diagnosis Date  . GERD (gastroesophageal reflux disease)   . Hypertension   . Hypercholesteremia   . TIA (transient ischemic attack)   . Syncope and collapse   . Diverticulosis of colon 02/08/12    Colonoscopy   Past Surgical History  Procedure Laterality Date  . Tonsilectomy/adenoidectomy with myringotomy  1950  . Cataract surgery  2012   Family History  Problem Relation Age of Onset  . Hypercholesterolemia Mother   . Stroke Mother   . Hypertension Mother   . Cancer Mother     ovarian/uterus  . Hypercholesterolemia Father   . Heart disease Father   . Heart attack Father   . Cancer Maternal Grandmother     unsure of what type   Social History   Social History  . Marital Status: Married    Spouse Name: N/A  . Number of Children: N/A  . Years of Education: N/A   Social History Main Topics  . Smoking status: Never Smoker   . Smokeless tobacco: Never Used  . Alcohol Use: No  . Drug Use: No  . Sexual Activity: Not Currently   Other Topics Concern  . None   Social History Narrative    Outpatient Encounter Prescriptions as of 04/26/2015  Medication Sig  . acetaminophen (TYLENOL ARTHRITIS PAIN) 650 MG CR tablet Take 650 mg by mouth as needed. Reported on 01/24/2015  . aspirin 81 MG tablet Take 81  mg by mouth daily.  Marland Kitchen atorvastatin (LIPITOR) 20 MG tablet TAKE 1 TABLET EVERY DAY  . galantamine (RAZADYNE) 8 MG tablet   . hydrochlorothiazide (HYDRODIURIL) 25 MG tablet TAKE 1/2 TABLET EVERY DAY  . lisinopril (PRINIVIL,ZESTRIL) 40 MG tablet TAKE 1 TABLET EVERY DAY  . metroNIDAZOLE (FLAGYL) 500 MG tablet Take 1 tablet (500 mg total) by mouth 3 (three) times daily.  . naproxen (NAPROSYN) 500 MG tablet Take 1 tablet (500 mg total) by mouth 2 (two) times daily with a meal.  . ondansetron (ZOFRAN ODT) 4 MG disintegrating tablet Take 1 tablet (4 mg total) by mouth every 8 (eight) hours as needed for nausea or vomiting.  Marland Kitchen oxyCODONE-acetaminophen (ROXICET) 5-325 MG tablet Take 1 tablet by mouth every 6 (six) hours as needed for severe pain.  Marland Kitchen Psyllium (METAMUCIL PO) Take by mouth daily. Reported on 01/24/2015  . ranitidine (ZANTAC) 150 MG tablet Take 150 mg by mouth as needed.  . simethicone (GAS-X) 80 MG chewable tablet Chew 80 mg by mouth as needed.  . ciprofloxacin (CIPRO) 500 MG tablet Take 1 tablet (500 mg total) by mouth 2 (two) times daily. (Patient not taking: Reported on 04/26/2015)   No facility-administered encounter medications on file as  of 04/26/2015.    Review of Systems  Constitutional: Positive for fatigue.       Some decreased appetite, but is eating.  Has lost weight.   Respiratory: Negative for cough, chest tightness and shortness of breath.   Cardiovascular: Negative for chest pain, palpitations and leg swelling.  Gastrointestinal: Positive for abdominal pain. Negative for nausea, vomiting and blood in stool.       Persistent LLQ pain.  Worsened.    Genitourinary: Negative for dysuria and difficulty urinating.  Musculoskeletal: Positive for back pain (left side).  Skin: Negative for color change and rash.  Neurological: Negative for dizziness, light-headedness and headaches.       Objective:    Physical Exam  Constitutional:  Uncomfortable appearing female.    HENT:    Mouth/Throat: Oropharynx is clear and moist.  Neck: Neck supple.  Cardiovascular: Normal rate and regular rhythm.   Pulmonary/Chest: Breath sounds normal. No respiratory distress. She has no wheezes.  Abdominal: Soft. Bowel sounds are normal.  Increased tenderness LLQ.  No rebound.    Musculoskeletal: She exhibits no edema or tenderness.  Lymphadenopathy:    She has no cervical adenopathy.  Skin: No rash noted. No erythema.  Psychiatric: Her behavior is normal.    BP 122/70 mmHg  Pulse 75  Temp(Src) 98.5 F (36.9 C) (Oral)  Resp 18  Ht 5\' 5"  (1.651 m)  Wt 132 lb 4 oz (59.988 kg)  BMI 22.01 kg/m2  SpO2 96% Wt Readings from Last 3 Encounters:  04/26/15 132 lb 4 oz (59.988 kg)  04/18/15 139 lb (63.05 kg)  01/24/15 134 lb 1.9 oz (60.836 kg)     Lab Results  Component Value Date   WBC 6.5 04/18/2015   HGB 12.8 04/18/2015   HCT 37.1 04/18/2015   PLT 288 04/18/2015   GLUCOSE 99 04/18/2015   CHOL 164 09/07/2014   TRIG 123.0 09/07/2014   HDL 55.20 09/07/2014   LDLDIRECT 237.0 07/15/2012   LDLCALC 84 09/07/2014   ALT 17 04/18/2015   AST 26 04/18/2015   NA 139 04/18/2015   K 4.2 04/18/2015   CL 106 04/18/2015   CREATININE 0.80 04/18/2015   BUN 10 04/18/2015   CO2 26 04/18/2015   TSH 1.90 06/05/2014   HGBA1C 5.4 09/07/2014    Ct Abdomen Pelvis W Contrast  04/18/2015  CLINICAL DATA:  Left lower quadrant pain for 5 days, history of diverticulitis EXAM: CT ABDOMEN AND PELVIS WITH CONTRAST TECHNIQUE: Multidetector CT imaging of the abdomen and pelvis was performed using the standard protocol following bolus administration of intravenous contrast. CONTRAST:  100mL ISOVUE-300 IOPAMIDOL (ISOVUE-300) INJECTION 61% COMPARISON:  08/08/2012 FINDINGS: Lower chest:  Lung bases are unremarkable. Hepatobiliary: Enhanced liver is unremarkable. No calcified gallstones are noted within gallbladder. No CBD dilatation. Pancreas: No mass, inflammatory changes, or other significant abnormality.  Spleen: Within normal limits in size and appearance. Adrenals/Urinary Tract: No adrenal gland mass is noted. Enhanced kidneys are symmetrical in size. No hydronephrosis or hydroureter. Delayed renal images shows bilateral renal symmetrical excretion. Bilateral visualized proximal ureter is unremarkable. The urinary bladder is unremarkable. Stomach/Bowel: No small bowel obstruction. No gastric outlet obstruction. No thickened or dilated small bowel loops. No pericecal inflammation. Normal appendix partially visualized axial image 59. Colonic diverticula are noted in descending colon. Multiple colonic diverticula are noted redundant proximal sigmoid colon. Axial image 54 there is inflammatory process in distal left colon left lower quadrant with pericolonic stranding. There is a small diverticular abscess just posterior to  distal left colon axial image 54 measures 1.6 cm. This is confirmed in sagittal image 123. There is stranding of pericolonic fat and trace fluid is noted in left paracolic gutter at this level. Findings are consistent with acute diverticulitis. There is no evidence of perforation or extraluminal air. Vascular/Lymphatic: Atherosclerotic calcifications of abdominal aorta and iliac arteries. No aortic aneurysm. There is no retroperitoneal or mesenteric adenopathy. Reproductive: Retroflexed uterus is noted. Unremarkable ovaries. No adnexal mass. Other: No abdominal ascites.  No evidence of free abdominal air. Musculoskeletal: Sagittal images of the spine shows degenerative changes lower thoracic and lumbar spine. There is levoscoliosis of upper lumbar spine. Significant disc space flattening with endplate sclerotic changes anterior and mild posterior spurring at L1-L2, L2-L3 L3-L4 and L4-L5 level. IMPRESSION: 1. There is inflammatory process in left lower quadrant just posterior to distal left colon with thickening of diverticular wall and small diverticular abscess measures about 1.6 cm. Findings  consistent with acute diverticulitis. There is stranding of pericolonic fat and tiny amount of fluid in left paracolic gutter at this level. No evidence of perforation or extraluminal air. 2. Multiple colonic diverticula in proximal sigmoid colon which is redundant. No evidence of acute diverticulitis within sigmoid colon. 3. Normal appendix.  No pericecal inflammation. 4. No small bowel obstruction. 5. No hydronephrosis or hydroureter. 6. Degenerative changes thoracolumbar spine. 7. Retroflexed uterus. Electronically Signed   By: Natasha Mead M.D.   On: 04/18/2015 09:56       Assessment & Plan:   Problem List Items Addressed This Visit    LLQ abdominal pain - Primary     She was recently evaluated in ER and placed on cipro and flagyl.  CT revealed diverticulitis with abscess present.  Pt presents today with persistent pain - increased.  She has been taking her cipro and flagyl.  Exam as outlined.  Concern failing outpt therapy.  Feel needs f/u CT scan, labs, etc.  Further w/up and treatment pending results.  This was discussed in detail with the pt and her husband.  ER notified.  Husband to take her to ER for evaluation.            Dale [place], MD

## 2015-04-26 NOTE — ED Provider Notes (Signed)
[facility] Emergency Department Provider Note    ____________________________________________  Time seen: ~1745  I have reviewed the triage vital signs and the nursing notes.   HISTORY  Chief Complaint Abdominal Pain   History limited by: Not Limited   HPI Anita Santana is a 75 y.o. female who presents to the emergency department today because of continued left sided abdominal pain. Is located in the left lower quadrant. Has been dull and achy. Has been constant. She was seen in the emergency department one week ago and diagnosed with diverticulitis. At her primary care follow-up today she was noted to have continued pain. At this point primary care thought she would benefit from reevaluation in the emergency department. Patient denies any fevers.    Past Medical History  Diagnosis Date  . GERD (gastroesophageal reflux disease)   . Hypertension   . Hypercholesteremia   . TIA (transient ischemic attack)   . Syncope and collapse   . Diverticulosis of colon 02/08/12    Colonoscopy    Patient Active Problem List   Diagnosis Date Noted  . LLQ abdominal pain 04/26/2015  . Health care maintenance 02/06/2014  . Hyperglycemia 02/06/2014  . Stress 08/04/2012  . Abnormal mammogram 07/17/2012  . Memory change 07/17/2012  . Diverticulosis of colon without hemorrhage 02/15/2012  . Dysrhythmia, cardiac 02/09/2012  . Hypertension 11/06/2011  . GERD (gastroesophageal reflux disease) 11/06/2011  . TIA (transient ischemic attack) 11/06/2011  . Hypercholesteremia 11/05/2011    Past Surgical History  Procedure Laterality Date  . Tonsilectomy/adenoidectomy with myringotomy  1950  . Cataract surgery  2012    Current Outpatient Rx  Name  Route  Sig  Dispense  Refill  . acetaminophen (TYLENOL ARTHRITIS PAIN) 650 MG CR tablet   Oral   Take 650 mg by mouth as needed. Reported on 01/24/2015         . aspirin 81 MG tablet   Oral   Take 81 mg by mouth  daily.         Marland Kitchen. atorvastatin (LIPITOR) 20 MG tablet      TAKE 1 TABLET EVERY DAY   90 tablet   1   . ciprofloxacin (CIPRO) 500 MG tablet   Oral   Take 1 tablet (500 mg total) by mouth 2 (two) times daily. Patient not taking: Reported on 04/26/2015   20 tablet   0   . galantamine (RAZADYNE) 8 MG tablet               . hydrochlorothiazide (HYDRODIURIL) 25 MG tablet      TAKE 1/2 TABLET EVERY DAY   45 tablet   1   . lisinopril (PRINIVIL,ZESTRIL) 40 MG tablet      TAKE 1 TABLET EVERY DAY   90 tablet   1   . metroNIDAZOLE (FLAGYL) 500 MG tablet   Oral   Take 1 tablet (500 mg total) by mouth 3 (three) times daily.   28 tablet   0   . naproxen (NAPROSYN) 500 MG tablet   Oral   Take 1 tablet (500 mg total) by mouth 2 (two) times daily with a meal.   60 tablet   0   . ondansetron (ZOFRAN ODT) 4 MG disintegrating tablet   Oral   Take 1 tablet (4 mg total) by mouth every 8 (eight) hours as needed for nausea or vomiting.   20 tablet   0   . oxyCODONE-acetaminophen (ROXICET) 5-325 MG tablet   Oral  Take 1 tablet by mouth every 6 (six) hours as needed for severe pain.   12 tablet   0   . Psyllium (METAMUCIL PO)   Oral   Take by mouth daily. Reported on 01/24/2015         . ranitidine (ZANTAC) 150 MG tablet   Oral   Take 150 mg by mouth as needed.         . simethicone (GAS-X) 80 MG chewable tablet   Oral   Chew 80 mg by mouth as needed.           Allergies Plavix and Penicillins  Family History  Problem Relation Age of Onset  . Hypercholesterolemia Mother   . Stroke Mother   . Hypertension Mother   . Cancer Mother     ovarian/uterus  . Hypercholesterolemia Father   . Heart disease Father   . Heart attack Father   . Cancer Maternal Grandmother     unsure of what type    Social History Social History  Substance Use Topics  . Smoking status: Never Smoker   . Smokeless tobacco: Never Used  . Alcohol Use: No    Review of  Systems  Constitutional: Negative for fever. Cardiovascular: Negative for chest pain. Respiratory: Negative for shortness of breath. Gastrointestinal: Positive for left lower quadrant abdominal pain Neurological: Negative for headaches, focal weakness or numbness.  10-point ROS otherwise negative.  ____________________________________________   PHYSICAL EXAM:  VITAL SIGNS: ED Triage Vitals  Enc Vitals Group     BP 04/26/15 1418 125/59 mmHg     Pulse Rate 04/26/15 1418 74     Resp 04/26/15 1418 16     Temp 04/26/15 1418 98.4 F (36.9 C)     Temp Source 04/26/15 1418 Oral     SpO2 04/26/15 1418 98 %     Weight 04/26/15 1418 132 lb (59.875 kg)     Height 04/26/15 1418  (1.651 m)     Head Cir --      Peak Flow --      Pain Score 04/26/15 1420 8   Constitutional: Alert and oriented. Well appearing and in no distress. Eyes: Conjunctivae are normal. PERRL. Normal extraocular movements. ENT   Head: Normocephalic and atraumatic.   Nose: No congestion/rhinnorhea.   Mouth/Throat: Mucous membranes are moist.   Neck: No stridor. Hematological/Lymphatic/Immunilogical: No cervical lymphadenopathy. Cardiovascular: Normal rate, regular rhythm.  No murmurs, rubs, or gallops. Respiratory: Normal respiratory effort without tachypnea nor retractions. Breath sounds are clear and equal bilaterally. No wheezes/rales/rhonchi. Gastrointestinal: Soft and Tender to palpation Left lower quadrant. No rebound. No guarding. Genitourinary: Deferred Musculoskeletal: Normal range of motion in all extremities. No joint effusions.  No lower extremity tenderness nor edema. Neurologic:  Normal speech and language. No gross focal neurologic deficits are appreciated.  Skin:  Skin is warm, dry and intact. No rash noted. Psychiatric: Mood and affect are normal. Speech and behavior are normal. Patient exhibits appropriate insight and judgment.  ____________________________________________     LABS (pertinent positives/negatives)  Labs Reviewed  COMPREHENSIVE METABOLIC PANEL - Abnormal; Notable for the following:    Glucose, Bld 101 (*)    GFR calc non Af Amer 58 (*)    All other components within normal limits  URINALYSIS COMPLETEWITH MICROSCOPIC ([facility] ONLY) - Abnormal; Notable for the following:    Color, Urine YELLOW (*)    APPearance CLEAR (*)    Leukocytes, UA TRACE (*)    Squamous Epithelial / LPF 0-5 (*)  All other components within normal limits  LIPASE, BLOOD  CBC     ____________________________________________   EKG  None  ____________________________________________    RADIOLOGY  CT abd.pel IMPRESSION: Near complete resolution of LEFT colon diverticulitis.  ____________________________________________   PROCEDURES  Procedure(s) performed: None  Critical Care performed: No  ____________________________________________   INITIAL IMPRESSION / ASSESSMENT AND PLAN / ED COURSE  Pertinent labs & imaging results that were available during my care of the patient were reviewed by me and considered in my medical decision making (see chart for details).  Patient presented to the emergency department today at the request of her primary care doctor because of concerns of continued left lower quadrant pain the setting of recently diagnosed diverticulitis. Repeat CT today shows a near complete resolution. Abdominal exam is significant for just mild tenderness on that left side. Certainly no peritoneal signs. Will plan on discharging patient home. Will have her follow up with primary care.  ____________________________________________   FINAL CLINICAL IMPRESSION(S) / ED DIAGNOSES  Final diagnoses:  Left lower quadrant pain     Phineas Semen, MD 04/26/15 2042

## 2015-04-26 NOTE — ED Notes (Signed)
Patient stable and ambulatory. Verbalized understanding of the discharge instructions.   

## 2015-04-26 NOTE — Discharge Instructions (Signed)
Please seek medical attention for any high fevers, chest pain, shortness of breath, change in behavior, persistent vomiting, bloody stool or any other new or concerning symptoms. ° ° °Diverticulitis °Diverticulitis is when small pockets that have formed in your colon (large intestine) become infected or swollen. °HOME CARE °· Follow your doctor's instructions. °· Follow a special diet if told by your doctor. °· When you feel better, your doctor may tell you to change your diet. You may be told to eat a lot of fiber. Fruits and vegetables are good sources of fiber. Fiber makes it easier to poop (have bowel movements). °· Take supplements or probiotics as told by your doctor. °· Only take medicines as told by your doctor. °· Keep all follow-up visits with your doctor. °GET HELP IF: °· Your pain does not get better. °· You have a hard time eating food. °· You are not pooping like normal. °GET HELP RIGHT AWAY IF: °· Your pain gets worse. °· Your problems do not get better. °· Your problems suddenly get worse. °· You have a fever. °· You keep throwing up (vomiting). °· You have bloody or black, tarry poop (stool). °MAKE SURE YOU:  °· Understand these instructions. °· Will watch your condition. °· Will get help right away if you are not doing well or get worse. °  °This information is not intended to replace advice given to you by your health care provider. Make sure you discuss any questions you have with your health care provider. °  °Document Released: 06/10/2007 Document Revised: 12/27/2012 Document Reviewed: 11/16/2012 °Elsevier Interactive Patient Education ©2016 [employer] ° °

## 2015-04-26 NOTE — Assessment & Plan Note (Signed)
She was recently evaluated in ER and placed on cipro and flagyl.  CT revealed diverticulitis with abscess present.  Pt presents today with persistent pain - increased.  She has been taking her cipro and flagyl.  Exam as outlined.  Concern failing outpt therapy.  Feel needs f/u CT scan, labs, etc.  Further w/up and treatment pending results.  This was discussed in detail with the pt and her husband.  ER notified.  Husband to take her to ER for evaluation.

## 2015-04-26 NOTE — ED Notes (Signed)
Sent to ED by Dr. Lorin PicketScott for evaluation.  Seen in ED one week ago and diagnosed with diverticulitis.  Returns today to ED due to "fluid collection in side".

## 2015-04-26 NOTE — Progress Notes (Signed)
Pre-visit discussion using our clinic review tool. No additional management support is needed unless otherwise documented below in the visit note.  

## 2015-04-26 NOTE — ED Notes (Signed)
Pt. Going home with husband. 

## 2015-04-30 ENCOUNTER — Telehealth: Payer: Self-pay | Admitting: *Deleted

## 2015-04-30 NOTE — Telephone Encounter (Signed)
Husband called back, he has been giving his wife Tylenol for pain only and it is only working for at most an hour.    Pain left side of lower quadrant , 9/10 on the pain scale.  It comes and goes in severity.   She is okay with a referral but would like something to help her pain wise.

## 2015-04-30 NOTE — Telephone Encounter (Signed)
Spoke to Anita Santana.  Pain still localized to the LLQ.  Had been given percocet by ER initially.  Took these.  Complained previously of hard stool.  Took a stool softener.  Now having increased loose stool when goes to the bathroom.  Is eating some.  No vomiting.  Discussed my concern regarding narcotic medication.  Could increased constipation and in the setting of an unclear etiology, may mask other symptoms.  Will contact surgery for evaluation given increased pain.

## 2015-04-30 NOTE — Telephone Encounter (Signed)
Husband of patient requested a medication for inflammation, patient currently is having left side pain. She has a CT scan on 04/26/15, that resulted as her diverticulitis was clear, however she has pain. Please advise  [phone]   Pharmacy: [facility]

## 2015-04-30 NOTE — Telephone Encounter (Signed)
Need to know if she has been taking anything for pain.  If so, what has she been taking and how often?  Also, given persistent increased pain despite resolution of diverticulitis, I would like for her to be evaluated by GI.  If agreeable, let me know and I will place the order for the referral.   Thanks.

## 2015-05-01 ENCOUNTER — Emergency Department
Admission: EM | Admit: 2015-05-01 | Discharge: 2015-05-01 | Disposition: A | Discharge status: Home / Self Care | Payer: Commercial Managed Care - HMO | Attending: Emergency Medicine | Admitting: Emergency Medicine

## 2015-05-01 ENCOUNTER — Encounter: Payer: Self-pay | Admitting: Emergency Medicine

## 2015-05-01 ENCOUNTER — Telehealth: Payer: Self-pay | Admitting: Internal Medicine

## 2015-05-01 DIAGNOSIS — K219 Gastro-esophageal reflux disease without esophagitis: Secondary | ICD-10-CM | POA: Insufficient documentation

## 2015-05-01 DIAGNOSIS — Z791 Long term (current) use of non-steroidal anti-inflammatories (NSAID): Secondary | ICD-10-CM | POA: Insufficient documentation

## 2015-05-01 DIAGNOSIS — I10 Essential (primary) hypertension: Secondary | ICD-10-CM | POA: Diagnosis not present

## 2015-05-01 DIAGNOSIS — Z7982 Long term (current) use of aspirin: Secondary | ICD-10-CM | POA: Insufficient documentation

## 2015-05-01 DIAGNOSIS — Z79899 Other long term (current) drug therapy: Secondary | ICD-10-CM | POA: Insufficient documentation

## 2015-05-01 DIAGNOSIS — K5732 Diverticulitis of large intestine without perforation or abscess without bleeding: Secondary | ICD-10-CM | POA: Diagnosis not present

## 2015-05-01 DIAGNOSIS — Z8673 Personal history of transient ischemic attack (TIA), and cerebral infarction without residual deficits: Secondary | ICD-10-CM | POA: Insufficient documentation

## 2015-05-01 DIAGNOSIS — R1084 Generalized abdominal pain: Secondary | ICD-10-CM | POA: Diagnosis not present

## 2015-05-01 DIAGNOSIS — R1032 Left lower quadrant pain: Secondary | ICD-10-CM | POA: Diagnosis not present

## 2015-05-01 LAB — CBC WITH DIFFERENTIAL/PLATELET
BASOS ABS: 0.1 10*3/uL (ref 0–0.1)
Basophils Relative: 1 %
EOS ABS: 0.2 10*3/uL (ref 0–0.7)
EOS PCT: 3 %
HCT: 36.2 % (ref 35.0–47.0)
Hemoglobin: 12.4 g/dL (ref 12.0–16.0)
LYMPHS PCT: 29 %
Lymphs Abs: 2.3 10*3/uL (ref 1.0–3.6)
MCH: 30 pg (ref 26.0–34.0)
MCHC: 34.2 g/dL (ref 32.0–36.0)
MCV: 87.9 fL (ref 80.0–100.0)
MONO ABS: 0.5 10*3/uL (ref 0.2–0.9)
Monocytes Relative: 7 %
Neutro Abs: 4.9 10*3/uL (ref 1.4–6.5)
Neutrophils Relative %: 60 %
PLATELETS: 287 10*3/uL (ref 150–440)
RBC: 4.12 MIL/uL (ref 3.80–5.20)
RDW: 14.2 % (ref 11.5–14.5)
WBC: 8 10*3/uL (ref 3.6–11.0)

## 2015-05-01 LAB — URINALYSIS COMPLETE WITH MICROSCOPIC (ARMC ONLY)
BILIRUBIN URINE: NEGATIVE
Bacteria, UA: NONE SEEN
Glucose, UA: NEGATIVE mg/dL
KETONES UR: NEGATIVE mg/dL
NITRITE: NEGATIVE
PROTEIN: NEGATIVE mg/dL
SPECIFIC GRAVITY, URINE: 1.008 (ref 1.005–1.030)
pH: 7 (ref 5.0–8.0)

## 2015-05-01 LAB — LIPASE, BLOOD: LIPASE: 21 U/L (ref 11–51)

## 2015-05-01 LAB — COMPREHENSIVE METABOLIC PANEL
ALK PHOS: 72 U/L (ref 38–126)
ALT: 18 U/L (ref 14–54)
AST: 22 U/L (ref 15–41)
Albumin: 3.9 g/dL (ref 3.5–5.0)
Anion gap: 10 (ref 5–15)
BILIRUBIN TOTAL: 0.6 mg/dL (ref 0.3–1.2)
BUN: 12 mg/dL (ref 6–20)
CALCIUM: 9.2 mg/dL (ref 8.9–10.3)
CHLORIDE: 107 mmol/L (ref 101–111)
CO2: 23 mmol/L (ref 22–32)
CREATININE: 0.78 mg/dL (ref 0.44–1.00)
Glucose, Bld: 98 mg/dL (ref 65–99)
Potassium: 3.6 mmol/L (ref 3.5–5.1)
Sodium: 140 mmol/L (ref 135–145)
Total Protein: 7.1 g/dL (ref 6.5–8.1)

## 2015-05-01 MED ORDER — CIPROFLOXACIN HCL 500 MG PO TABS: 500.0000 mg | ORAL_TABLET | Freq: Two times a day (BID) | ORAL | Status: DC

## 2015-05-01 MED ORDER — METRONIDAZOLE 500 MG PO TABS: 500.0000 mg | ORAL_TABLET | Freq: Three times a day (TID) | ORAL | Status: DC

## 2015-05-01 MED ORDER — ONDANSETRON 4 MG PO TBDP: 4.0000 mg | ORAL_TABLET | Freq: Three times a day (TID) | ORAL | Status: DC | PRN

## 2015-05-01 NOTE — Telephone Encounter (Signed)
Will follow-up tomorrow-pt has appt with GI tomorrow 4/27 @ 3pm.

## 2015-05-01 NOTE — ED Notes (Signed)
Patient comes from home with c/o abdominal pain. Patient was seen here on Friday for the same and dx with diverticulosis. Patient is scheduled to follow up with a gastroenterologist but came in today because the pain was getting worse. Patient denies urinary symptoms. MD at bedside.

## 2015-05-01 NOTE — Discharge Instructions (Signed)
Diverticulitis °Diverticulitis is inflammation or infection of small pouches in your colon that form when you have a condition called diverticulosis. The pouches in your colon are called diverticula. Your colon, or large intestine, is where water is absorbed and stool is formed. °Complications of diverticulitis can include: °· Bleeding. °· Severe infection. °· Severe pain. °· Perforation of your colon. °· Obstruction of your colon. °CAUSES  °Diverticulitis is caused by bacteria. °Diverticulitis happens when stool becomes trapped in diverticula. This allows bacteria to grow in the diverticula, which can lead to inflammation and infection. °RISK FACTORS °People with diverticulosis are at risk for diverticulitis. Eating a diet that does not include enough fiber from fruits and vegetables may make diverticulitis more likely to develop. °SYMPTOMS  °Symptoms of diverticulitis may include: °· Abdominal pain and tenderness. The pain is normally located on the left side of the abdomen, but may occur in other areas. °· Fever and chills. °· Bloating. °· Cramping. °· Nausea. °· Vomiting. °· Constipation. °· Diarrhea. °· Blood in your stool. °DIAGNOSIS  °Your health care provider will ask you about your medical history and do a physical exam. You may need to have tests done because many medical conditions can cause the same symptoms as diverticulitis. Tests may include: °· Blood tests. °· Urine tests. °· Imaging tests of the abdomen, including X-rays and CT scans. °When your condition is under control, your health care provider may recommend that you have a colonoscopy. A colonoscopy can show how severe your diverticula are and whether something else is causing your symptoms. °TREATMENT  °Most cases of diverticulitis are mild and can be treated at home. Treatment may include: °· Taking over-the-counter pain medicines. °· Following a clear liquid diet. °· Taking antibiotic medicines by mouth for 7-10 days. °More severe cases may  be treated at a hospital. Treatment may include: °· Not eating or drinking. °· Taking prescription pain medicine. °· Receiving antibiotic medicines through an IV tube. °· Receiving fluids and nutrition through an IV tube. °· Surgery. °HOME CARE INSTRUCTIONS  °· Follow your health care provider's instructions carefully. °· Follow a full liquid diet or other diet as directed by your health care provider. After your symptoms improve, your health care provider may tell you to change your diet. He or she may recommend you eat a high-fiber diet. Fruits and vegetables are good sources of fiber. Fiber makes it easier to pass stool. °· Take fiber supplements or probiotics as directed by your health care provider. °· Only take medicines as directed by your health care provider. °· Keep all your follow-up appointments. °SEEK MEDICAL CARE IF:  °· Your pain does not improve. °· You have a hard time eating food. °· Your bowel movements do not return to normal. °SEEK IMMEDIATE MEDICAL CARE IF:  °· Your pain becomes worse. °· Your symptoms do not get better. °· Your symptoms suddenly get worse. °· You have a fever. °· You have repeated vomiting. °· You have bloody or black, tarry stools. °MAKE SURE YOU:  °· Understand these instructions. °· Will watch your condition. °· Will get help right away if you are not doing well or get worse. °  °This information is not intended to replace advice given to you by your health care provider. Make sure you discuss any questions you have with your health care provider. °  °Document Released: 10/01/2004 Document Revised: 12/27/2012 Document Reviewed: 11/16/2012 °Elsevier Interactive Patient Education ©2016 [employer] ° °

## 2015-05-01 NOTE — Telephone Encounter (Signed)
Pt husband called stating that he had to call EMS because Anita Santana was having sever abdominal pain & her right arm wouldn't stop shaking they are taking [facility] to [facility].Marland Kitchen. Please advise pt husband with any questions

## 2015-05-01 NOTE — ED Provider Notes (Signed)
[facility] Emergency Department Provider Note  ____________________________________________  Time seen: 1:25 PM  I have reviewed the triage vital signs and the nursing notes.   HISTORY  Chief Complaint Abdominal Pain    HPI Anita Santana is a 75 y.o. female who complains of left lower quadrant abdominal pain that started worsening this morning. She recently was treated with Cipro and Flagyl for diverticulitis. She was seen again in the emergency department 4 days ago and a repeat CT scan showed that it had almost completely resolved. No fevers chills nausea vomiting or diarrhea. No chest pain shortness of breath or syncopal. Pain is moderate intensity nonradiating and aching and sharp. No aggravating or alleviating factors.     Past Medical History  Diagnosis Date  . GERD (gastroesophageal reflux disease)   . Hypertension   . Hypercholesteremia   . TIA (transient ischemic attack)   . Syncope and collapse   . Diverticulosis of colon 02/08/12    Colonoscopy     Patient Active Problem List   Diagnosis Date Noted  . LLQ abdominal pain 04/26/2015  . Health care maintenance 02/06/2014  . Hyperglycemia 02/06/2014  . Stress 08/04/2012  . Abnormal mammogram 07/17/2012  . Memory change 07/17/2012  . Diverticulosis of colon without hemorrhage 02/15/2012  . Dysrhythmia, cardiac 02/09/2012  . Hypertension 11/06/2011  . GERD (gastroesophageal reflux disease) 11/06/2011  . TIA (transient ischemic attack) 11/06/2011  . Hypercholesteremia 11/05/2011     Past Surgical History  Procedure Laterality Date  . Tonsilectomy/adenoidectomy with myringotomy  1950  . Cataract surgery  2012     Current Outpatient Rx  Name  Route  Sig  Dispense  Refill  . acetaminophen (TYLENOL ARTHRITIS PAIN) 650 MG CR tablet   Oral   Take 650 mg by mouth as needed. Reported on 01/24/2015         . aspirin 81 MG tablet   Oral   Take 81 mg by mouth daily.         Marland Kitchen  atorvastatin (LIPITOR) 20 MG tablet      TAKE 1 TABLET EVERY DAY   90 tablet   1   . ciprofloxacin (CIPRO) 500 MG tablet   Oral   Take 1 tablet (500 mg total) by mouth 2 (two) times daily.   14 tablet   0   . dicyclomine (BENTYL) 20 MG tablet   Oral   Take 1 tablet (20 mg total) by mouth 3 (three) times daily as needed (abdominal pain).   30 tablet   0   . galantamine (RAZADYNE) 8 MG tablet               . hydrochlorothiazide (HYDRODIURIL) 25 MG tablet      TAKE 1/2 TABLET EVERY DAY   45 tablet   1   . lisinopril (PRINIVIL,ZESTRIL) 40 MG tablet      TAKE 1 TABLET EVERY DAY   90 tablet   1   . metroNIDAZOLE (FLAGYL) 500 MG tablet   Oral   Take 1 tablet (500 mg total) by mouth 3 (three) times daily.   30 tablet   0   . naproxen (NAPROSYN) 500 MG tablet   Oral   Take 1 tablet (500 mg total) by mouth 2 (two) times daily with a meal.   60 tablet   0   . ondansetron (ZOFRAN ODT) 4 MG disintegrating tablet   Oral   Take 1 tablet (4 mg total) by mouth every 8 (  eight) hours as needed for nausea or vomiting.   20 tablet   0   . oxyCODONE-acetaminophen (ROXICET) 5-325 MG tablet   Oral   Take 1 tablet by mouth every 6 (six) hours as needed for severe pain.   12 tablet   0   . Psyllium (METAMUCIL PO)   Oral   Take by mouth daily. Reported on 01/24/2015         . ranitidine (ZANTAC) 150 MG tablet   Oral   Take 150 mg by mouth as needed.         . simethicone (GAS-X) 80 MG chewable tablet   Oral   Chew 80 mg by mouth as needed.            Allergies Plavix and Penicillins   Family History  Problem Relation Age of Onset  . Hypercholesterolemia Mother   . Stroke Mother   . Hypertension Mother   . Cancer Mother     ovarian/uterus  . Hypercholesterolemia Father   . Heart disease Father   . Heart attack Father   . Cancer Maternal Grandmother     unsure of what type    Social History Social History  Substance Use Topics  . Smoking status:  Never Smoker   . Smokeless tobacco: Never Used  . Alcohol Use: No    Review of Systems  Constitutional:   No fever or chills.  Eyes:   No vision changes.  ENT:   No sore throat. No rhinorrhea. Cardiovascular:   No chest pain. Respiratory:   No dyspnea or cough. Gastrointestinal:   Positive for abdominal pain, without vomiting and diarrhea.  Genitourinary:   Negative for dysuria or difficulty urinating. Musculoskeletal:   Negative for focal pain or swelling Neurological:   Negative for headaches 10-point ROS otherwise negative.  ____________________________________________   PHYSICAL EXAM:  VITAL SIGNS: ED Triage Vitals  Enc Vitals Group     BP 05/01/15 1328 136/74 mmHg     Pulse Rate 05/01/15 1328 52     Resp 05/01/15 1328 17     Temp 05/01/15 1328 98.3 F (36.8 C)     Temp Source 05/01/15 1328 Oral     SpO2 05/01/15 1328 100 %     Weight 05/01/15 1328 140 lb (63.504 kg)     Height 05/01/15 1328 5\' 6"  (1.676 m)     Head Cir --      Peak Flow --      Pain Score 05/01/15 1329 7     Pain Loc --      Pain Edu? --      Excl. in GC? --     Vital signs reviewed, nursing assessments reviewed.   Constitutional:   Alert and oriented. Well appearing and in no distress. Eyes:   No scleral icterus. No conjunctival pallor. PERRL. EOMI.  No nystagmus. ENT   Head:   Normocephalic and atraumatic.   Nose:   No congestion/rhinnorhea. No septal hematoma   Mouth/Throat:   MMM, no pharyngeal erythema. No peritonsillar mass.    Neck:   No stridor. No SubQ emphysema. No meningismus. Hematological/Lymphatic/Immunilogical:   No cervical lymphadenopathy. Cardiovascular:   RRR. Symmetric bilateral radial and DP pulses.  No murmurs.  Respiratory:   Normal respiratory effort without tachypnea nor retractions. Breath sounds are clear and equal bilaterally. No wheezes/rales/rhonchi. Gastrointestinal:   Soft With left lower quadrant tenderness. Non distended. There is no CVA  tenderness.  No rebound, rigidity, or guarding. Genitourinary:  deferred Musculoskeletal:   Nontender with normal range of motion in all extremities. No joint effusions.  No lower extremity tenderness.  No edema. Neurologic:   Normal speech and language.  CN 2-10 normal. Motor grossly intact. No gross focal neurologic deficits are appreciated.  Skin:    Skin is warm, dry and intact. No rash noted.  No petechiae, purpura, or bullae.  ____________________________________________    LABS (pertinent positives/negatives) (all labs ordered are listed, but only abnormal results are displayed) Labs Reviewed  URINALYSIS COMPLETEWITH MICROSCOPIC ([facility] ONLY) - Abnormal; Notable for the following:    Color, Urine YELLOW (*)    APPearance HAZY (*)    Hgb urine dipstick 1+ (*)    Leukocytes, UA 1+ (*)    Squamous Epithelial / LPF 6-30 (*)    All other components within normal limits  URINE CULTURE  COMPREHENSIVE METABOLIC PANEL  LIPASE, BLOOD  CBC WITH DIFFERENTIAL/PLATELET   ____________________________________________   EKG    ____________________________________________    RADIOLOGY    ____________________________________________   PROCEDURES   ____________________________________________   INITIAL IMPRESSION / ASSESSMENT AND PLAN / ED COURSE  Pertinent labs & imaging results that were available during my care of the patient were reviewed by me and considered in my medical decision making (see chart for details).  Patient reports worsening of her left lower quadrant abdominal pain which is consistent with prior diverticulitis. No fever. Vital signs unremarkable. Labs are all unremarkable. No evidence of urinary tract infection. Given the resolving diverticulitis and essentially unremarkable CT scan from 4 days ago, diabetes highly likely that the patient has developed complicated diverticulitis with abscess or perforation in just the last few days. The last 24 hours of  worsening symptoms. Repeat CT scan is not necessary at this time. I'll restart her on Cipro and Flagyl and have her follow-up with her GI doctor as planned very soon. Follow-up with primary care. Return precautions given. He is stable, calm and comfortable.     ____________________________________________   FINAL CLINICAL IMPRESSION(S) / ED DIAGNOSES  Final diagnoses:  Diverticulitis of large intestine without perforation or abscess without bleeding       Portions of this note were generated with dragon dictation software. Dictation errors may occur despite best attempts at proofreading.   Sharman Cheek, MD 05/01/15 1530

## 2015-05-02 ENCOUNTER — Other Ambulatory Visit: Payer: Self-pay | Admitting: Gastroenterology

## 2015-05-02 ENCOUNTER — Ambulatory Visit
Admission: RE | Admit: 2015-05-02 | Discharge: 2015-05-02 | Disposition: A | Discharge status: Home / Self Care | Payer: Commercial Managed Care - HMO | Source: Ambulatory Visit | Attending: Gastroenterology | Admitting: Gastroenterology

## 2015-05-02 DIAGNOSIS — R1032 Left lower quadrant pain: Secondary | ICD-10-CM | POA: Insufficient documentation

## 2015-05-02 DIAGNOSIS — K5732 Diverticulitis of large intestine without perforation or abscess without bleeding: Secondary | ICD-10-CM | POA: Diagnosis not present

## 2015-05-02 NOTE — Telephone Encounter (Signed)
Pt notified to keep appt today @ 3pm (was discharged yesterday afternoon)

## 2015-05-03 DIAGNOSIS — R1032 Left lower quadrant pain: Secondary | ICD-10-CM | POA: Diagnosis not present

## 2015-05-06 ENCOUNTER — Telehealth: Payer: Self-pay | Admitting: Internal Medicine

## 2015-05-06 DIAGNOSIS — R531 Weakness: Secondary | ICD-10-CM

## 2015-05-06 DIAGNOSIS — K5732 Diverticulitis of large intestine without perforation or abscess without bleeding: Secondary | ICD-10-CM

## 2015-05-06 LAB — URINE CULTURE: Culture: 80000 — AB

## 2015-05-06 NOTE — Telephone Encounter (Signed)
Daughter [phone] back, Order is for Home Health Aid daughter is going out of town and she needs someone to do basic care needs for them and give medication. Needed for once a week for the month of May. Daughter is leaving from 05/7-5/14. [facility] states Dr Lorin PicketScott has the order form. [facility] did not give a form. It can be faxed over. Thank you!

## 2015-05-06 NOTE — Telephone Encounter (Signed)
Please advise if you have seen this in Dr.Scotts mailbox or if you have faxed already?

## 2015-05-06 NOTE — Telephone Encounter (Signed)
[id number] [phone] [phone] daughter called regarding needing a home health order fax to [facility] so the insurance will cover. Fax number is [phone]. Thank you!

## 2015-05-06 NOTE — Telephone Encounter (Signed)
Do I need to complete an order form or [facility]  See daughter's note and request.  Thanks

## 2015-05-06 NOTE — Telephone Encounter (Signed)
Need more information. I left daughter a message stating that we need to know what the order is for. Why is it needed, what type of order is needed & how often. Also need to know if [facility] is planning on sending us over something to complete. Nothing received so far.

## 2015-05-07 NOTE — Telephone Encounter (Signed)
Home health order for referral placed.  pts daughter [facility].

## 2015-05-07 NOTE — Telephone Encounter (Signed)
Per [facility] they have never seen this patient and they will need a referral.msn

## 2015-05-08 DIAGNOSIS — R413 Other amnesia: Secondary | ICD-10-CM | POA: Diagnosis not present

## 2015-05-08 DIAGNOSIS — E7800 Pure hypercholesterolemia, unspecified: Secondary | ICD-10-CM | POA: Diagnosis not present

## 2015-05-08 DIAGNOSIS — R1012 Left upper quadrant pain: Secondary | ICD-10-CM | POA: Diagnosis not present

## 2015-05-08 DIAGNOSIS — Z8673 Personal history of transient ischemic attack (TIA), and cerebral infarction without residual deficits: Secondary | ICD-10-CM | POA: Diagnosis not present

## 2015-05-08 DIAGNOSIS — I10 Essential (primary) hypertension: Secondary | ICD-10-CM | POA: Diagnosis not present

## 2015-05-08 DIAGNOSIS — R1032 Left lower quadrant pain: Secondary | ICD-10-CM | POA: Diagnosis not present

## 2015-05-08 DIAGNOSIS — R531 Weakness: Secondary | ICD-10-CM | POA: Diagnosis not present

## 2015-05-08 DIAGNOSIS — K5790 Diverticulosis of intestine, part unspecified, without perforation or abscess without bleeding: Secondary | ICD-10-CM | POA: Diagnosis not present

## 2015-05-08 DIAGNOSIS — K219 Gastro-esophageal reflux disease without esophagitis: Secondary | ICD-10-CM | POA: Diagnosis not present

## 2015-05-10 ENCOUNTER — Telehealth: Payer: Self-pay | Admitting: *Deleted

## 2015-05-10 NOTE — Telephone Encounter (Signed)
[facility] verbal order for physical therapy 3x a week for 3 weeks and 2x a week for 3 weeks.  Tresa EndoKelly [phone]

## 2015-05-10 NOTE — Telephone Encounter (Signed)
Verbal order for patient given.

## 2015-05-10 NOTE — Telephone Encounter (Signed)
Ok

## 2015-05-10 NOTE — Telephone Encounter (Signed)
Please advise 

## 2015-05-15 DIAGNOSIS — R1032 Left lower quadrant pain: Secondary | ICD-10-CM | POA: Diagnosis not present

## 2015-05-15 DIAGNOSIS — K5790 Diverticulosis of intestine, part unspecified, without perforation or abscess without bleeding: Secondary | ICD-10-CM | POA: Diagnosis not present

## 2015-05-15 DIAGNOSIS — K219 Gastro-esophageal reflux disease without esophagitis: Secondary | ICD-10-CM | POA: Diagnosis not present

## 2015-05-15 DIAGNOSIS — R531 Weakness: Secondary | ICD-10-CM | POA: Diagnosis not present

## 2015-05-15 DIAGNOSIS — I10 Essential (primary) hypertension: Secondary | ICD-10-CM | POA: Diagnosis not present

## 2015-05-15 DIAGNOSIS — Z8673 Personal history of transient ischemic attack (TIA), and cerebral infarction without residual deficits: Secondary | ICD-10-CM | POA: Diagnosis not present

## 2015-05-15 DIAGNOSIS — R413 Other amnesia: Secondary | ICD-10-CM | POA: Diagnosis not present

## 2015-05-15 DIAGNOSIS — E7800 Pure hypercholesterolemia, unspecified: Secondary | ICD-10-CM | POA: Diagnosis not present

## 2015-05-22 DIAGNOSIS — K5790 Diverticulosis of intestine, part unspecified, without perforation or abscess without bleeding: Secondary | ICD-10-CM | POA: Diagnosis not present

## 2015-05-22 DIAGNOSIS — R1032 Left lower quadrant pain: Secondary | ICD-10-CM | POA: Diagnosis not present

## 2015-05-22 DIAGNOSIS — K219 Gastro-esophageal reflux disease without esophagitis: Secondary | ICD-10-CM | POA: Diagnosis not present

## 2015-05-22 DIAGNOSIS — Z8673 Personal history of transient ischemic attack (TIA), and cerebral infarction without residual deficits: Secondary | ICD-10-CM | POA: Diagnosis not present

## 2015-05-22 DIAGNOSIS — R531 Weakness: Secondary | ICD-10-CM | POA: Diagnosis not present

## 2015-05-22 DIAGNOSIS — R413 Other amnesia: Secondary | ICD-10-CM | POA: Diagnosis not present

## 2015-05-22 DIAGNOSIS — E7800 Pure hypercholesterolemia, unspecified: Secondary | ICD-10-CM | POA: Diagnosis not present

## 2015-05-22 DIAGNOSIS — I10 Essential (primary) hypertension: Secondary | ICD-10-CM | POA: Diagnosis not present

## 2015-05-29 DIAGNOSIS — R531 Weakness: Secondary | ICD-10-CM | POA: Diagnosis not present

## 2015-05-29 DIAGNOSIS — R413 Other amnesia: Secondary | ICD-10-CM | POA: Diagnosis not present

## 2015-05-29 DIAGNOSIS — E7800 Pure hypercholesterolemia, unspecified: Secondary | ICD-10-CM | POA: Diagnosis not present

## 2015-05-29 DIAGNOSIS — K5790 Diverticulosis of intestine, part unspecified, without perforation or abscess without bleeding: Secondary | ICD-10-CM | POA: Diagnosis not present

## 2015-05-29 DIAGNOSIS — I10 Essential (primary) hypertension: Secondary | ICD-10-CM | POA: Diagnosis not present

## 2015-05-29 DIAGNOSIS — K219 Gastro-esophageal reflux disease without esophagitis: Secondary | ICD-10-CM | POA: Diagnosis not present

## 2015-05-29 DIAGNOSIS — Z8673 Personal history of transient ischemic attack (TIA), and cerebral infarction without residual deficits: Secondary | ICD-10-CM | POA: Diagnosis not present

## 2015-05-29 DIAGNOSIS — R1032 Left lower quadrant pain: Secondary | ICD-10-CM | POA: Diagnosis not present

## 2015-06-05 DIAGNOSIS — E7800 Pure hypercholesterolemia, unspecified: Secondary | ICD-10-CM | POA: Diagnosis not present

## 2015-06-05 DIAGNOSIS — R1032 Left lower quadrant pain: Secondary | ICD-10-CM | POA: Diagnosis not present

## 2015-06-05 DIAGNOSIS — K5790 Diverticulosis of intestine, part unspecified, without perforation or abscess without bleeding: Secondary | ICD-10-CM | POA: Diagnosis not present

## 2015-06-05 DIAGNOSIS — R413 Other amnesia: Secondary | ICD-10-CM | POA: Diagnosis not present

## 2015-06-05 DIAGNOSIS — Z8673 Personal history of transient ischemic attack (TIA), and cerebral infarction without residual deficits: Secondary | ICD-10-CM | POA: Diagnosis not present

## 2015-06-05 DIAGNOSIS — I10 Essential (primary) hypertension: Secondary | ICD-10-CM | POA: Diagnosis not present

## 2015-06-05 DIAGNOSIS — R531 Weakness: Secondary | ICD-10-CM | POA: Diagnosis not present

## 2015-06-05 DIAGNOSIS — K219 Gastro-esophageal reflux disease without esophagitis: Secondary | ICD-10-CM | POA: Diagnosis not present

## 2015-06-19 DIAGNOSIS — I10 Essential (primary) hypertension: Secondary | ICD-10-CM | POA: Diagnosis not present

## 2015-06-19 DIAGNOSIS — R1032 Left lower quadrant pain: Secondary | ICD-10-CM | POA: Diagnosis not present

## 2015-06-19 DIAGNOSIS — R531 Weakness: Secondary | ICD-10-CM | POA: Diagnosis not present

## 2015-06-19 DIAGNOSIS — Z8673 Personal history of transient ischemic attack (TIA), and cerebral infarction without residual deficits: Secondary | ICD-10-CM | POA: Diagnosis not present

## 2015-06-19 DIAGNOSIS — K5790 Diverticulosis of intestine, part unspecified, without perforation or abscess without bleeding: Secondary | ICD-10-CM | POA: Diagnosis not present

## 2015-06-19 DIAGNOSIS — R413 Other amnesia: Secondary | ICD-10-CM | POA: Diagnosis not present

## 2015-06-19 DIAGNOSIS — K219 Gastro-esophageal reflux disease without esophagitis: Secondary | ICD-10-CM | POA: Diagnosis not present

## 2015-06-19 DIAGNOSIS — E7800 Pure hypercholesterolemia, unspecified: Secondary | ICD-10-CM | POA: Diagnosis not present

## 2015-06-21 ENCOUNTER — Other Ambulatory Visit: Payer: Self-pay | Admitting: Internal Medicine

## 2015-07-01 ENCOUNTER — Ambulatory Visit
Admission: RE | Admit: 2015-07-01 | Discharge: 2015-07-01 | Disposition: A | Discharge status: Home / Self Care | Payer: Commercial Managed Care - HMO | Source: Ambulatory Visit | Attending: Gastroenterology | Admitting: Gastroenterology

## 2015-07-01 ENCOUNTER — Encounter
Admission: RE | Disposition: A | Discharge status: Home / Self Care | Payer: Self-pay | Source: Ambulatory Visit | Attending: Gastroenterology

## 2015-07-01 ENCOUNTER — Encounter: Payer: Self-pay | Admitting: Anesthesiology

## 2015-07-01 ENCOUNTER — Ambulatory Visit: Payer: Commercial Managed Care - HMO | Admitting: Anesthesiology

## 2015-07-01 DIAGNOSIS — E7800 Pure hypercholesterolemia, unspecified: Secondary | ICD-10-CM | POA: Diagnosis not present

## 2015-07-01 DIAGNOSIS — Z538 Procedure and treatment not carried out for other reasons: Secondary | ICD-10-CM | POA: Insufficient documentation

## 2015-07-01 DIAGNOSIS — Z7982 Long term (current) use of aspirin: Secondary | ICD-10-CM | POA: Diagnosis not present

## 2015-07-01 DIAGNOSIS — K5730 Diverticulosis of large intestine without perforation or abscess without bleeding: Secondary | ICD-10-CM | POA: Diagnosis not present

## 2015-07-01 DIAGNOSIS — R1032 Left lower quadrant pain: Secondary | ICD-10-CM | POA: Insufficient documentation

## 2015-07-01 DIAGNOSIS — K5732 Diverticulitis of large intestine without perforation or abscess without bleeding: Secondary | ICD-10-CM | POA: Insufficient documentation

## 2015-07-01 DIAGNOSIS — K219 Gastro-esophageal reflux disease without esophagitis: Secondary | ICD-10-CM | POA: Insufficient documentation

## 2015-07-01 DIAGNOSIS — Z8719 Personal history of other diseases of the digestive system: Secondary | ICD-10-CM | POA: Diagnosis not present

## 2015-07-01 DIAGNOSIS — Z8673 Personal history of transient ischemic attack (TIA), and cerebral infarction without residual deficits: Secondary | ICD-10-CM | POA: Diagnosis not present

## 2015-07-01 DIAGNOSIS — I10 Essential (primary) hypertension: Secondary | ICD-10-CM | POA: Diagnosis not present

## 2015-07-01 DIAGNOSIS — Z79899 Other long term (current) drug therapy: Secondary | ICD-10-CM | POA: Diagnosis not present

## 2015-07-01 HISTORY — PX: COLONOSCOPY WITH PROPOFOL: SHX5780

## 2015-07-01 SURGERY — COLONOSCOPY WITH PROPOFOL
Anesthesia: General

## 2015-07-01 MED ORDER — SODIUM CHLORIDE 0.9 % IV SOLN
INTRAVENOUS | Status: DC
  Administered 2015-07-01: 1000 mL via INTRAVENOUS

## 2015-07-01 MED ORDER — PROPOFOL 500 MG/50ML IV EMUL
INTRAVENOUS | Status: DC | PRN
  Administered 2015-07-01: 100 ug/kg/min via INTRAVENOUS

## 2015-07-01 MED ORDER — SODIUM CHLORIDE 0.9 % IV SOLN: INTRAVENOUS | Status: DC

## 2015-07-01 MED ORDER — LIDOCAINE HCL (CARDIAC) 20 MG/ML IV SOLN
INTRAVENOUS | Status: DC | PRN
  Administered 2015-07-01: 20 mg via INTRAVENOUS

## 2015-07-01 MED ORDER — GLYCOPYRROLATE 0.2 MG/ML IJ SOLN
INTRAMUSCULAR | Status: DC | PRN
  Administered 2015-07-01: 0.1 mg via INTRAVENOUS

## 2015-07-01 NOTE — H&P (Signed)
Outpatient short stay form Pre-procedure 07/01/2015 2:09 PM Christena DeemMartin U Skulskie MD  Primary Physician: Dr. Dale Durhamharlene Scott  Reason for visit:  Colonoscopy  History of present illness:  Patient is a 75 year old female presenting today for follow-up on a recent bout of diverticulitis. She's been having chronic left lower quadrant pain. She's had multiple episodes of diverticulitis that was treated since this past January about 6 months ago. She also has a previous history of diverticulitis in the past. She currently takes no aspirin products or blood taking agents. She tolerated her prep well.    Current facility-administered medications:  .  0.9 %  sodium chloride infusion, , Intravenous, Continuous, Christena DeemMartin U Skulskie, MD, Last Rate: 20 mL/hr at 07/01/15 1255, 1,000 mL at 07/01/15 1255 .  0.9 %  sodium chloride infusion, , Intravenous, Continuous, Christena DeemMartin U Skulskie, MD  Prescriptions prior to admission  Medication Sig Dispense Refill Last Dose  . aspirin 81 MG tablet Take 81 mg by mouth daily.   Taking  . atorvastatin (LIPITOR) 20 MG tablet Take 1 tablet (20 mg total) by mouth daily. NEED APPT FOR ADDITIONAL REFILLS 90 tablet 0   . galantamine (RAZADYNE) 8 MG tablet Reported on 07/01/2015     . hydrochlorothiazide (HYDRODIURIL) 25 MG tablet TAKE 1/2 TABLET EVERY DAY 45 tablet 1 Taking  . lisinopril (PRINIVIL,ZESTRIL) 40 MG tablet TAKE 1 TABLET EVERY DAY 90 tablet 1   . naproxen (NAPROSYN) 500 MG tablet Take 1 tablet (500 mg total) by mouth 2 (two) times daily with a meal. 60 tablet 0 Taking  . ondansetron (ZOFRAN ODT) 4 MG disintegrating tablet Take 1 tablet (4 mg total) by mouth every 8 (eight) hours as needed for nausea or vomiting. 20 tablet 0   . oxyCODONE-acetaminophen (ROXICET) 5-325 MG tablet Take 1 tablet by mouth every 6 (six) hours as needed for severe pain. 12 tablet 0 Taking  . Psyllium (METAMUCIL PO) Take by mouth daily. Reported on 01/24/2015   Taking  . ranitidine (ZANTAC) 150 MG  tablet Take 150 mg by mouth as needed.   Taking  . acetaminophen (TYLENOL ARTHRITIS PAIN) 650 MG CR tablet Take 650 mg by mouth as needed. Reported on 01/24/2015   Taking  . ciprofloxacin (CIPRO) 500 MG tablet Take 1 tablet (500 mg total) by mouth 2 (two) times daily. (Patient not taking: Reported on 07/01/2015) 14 tablet 0 Completed Course at Unknown time  . dicyclomine (BENTYL) 20 MG tablet Take 1 tablet (20 mg total) by mouth 3 (three) times daily as needed (abdominal pain). (Patient not taking: Reported on 07/01/2015) 30 tablet 0 Completed Course at Unknown time  . metroNIDAZOLE (FLAGYL) 500 MG tablet Take 1 tablet (500 mg total) by mouth 3 (three) times daily. (Patient not taking: Reported on 07/01/2015) 30 tablet 0 Completed Course at Unknown time  . pantoprazole (PROTONIX) 40 MG tablet TAKE 1 TABLET EVERY DAY (Patient not taking: Reported on 07/01/2015) 90 tablet 1 Not Taking at Unknown time  . simethicone (GAS-X) 80 MG chewable tablet Chew 80 mg by mouth as needed.   Taking     Allergies  Allergen Reactions  . Plavix [Clopidogrel Bisulfate] Hives and Swelling  . Penicillins Rash     Past Medical History  Diagnosis Date  . GERD (gastroesophageal reflux disease)   . Hypertension   . Hypercholesteremia   . TIA (transient ischemic attack)   . Syncope and collapse   . Diverticulosis of colon 02/08/12    Colonoscopy    Review of  systems:      Physical Exam    Heart and lungs: Regular rate and rhythm without rub or gallop, lungs are bilaterally clear.    HEENT: Normocephalic atraumatic eyes are anicteric    Other:     Pertinant exam for procedure: Soft positive mild tenderness to palpation in the left lower quadrant. There are no masses or rebound. Bowel sounds are positive normoactive.    Planned proceedures: Colonoscopy and indicated procedures. I have discussed the risks benefits and complications of procedures to include not limited to bleeding, infection, perforation and the  risk of sedation and the patient wishes to proceed. Christena DeemMartin U Skulskie, MD Gastroenterology

## 2015-07-01 NOTE — Op Note (Signed)
[facility] Gastroenterology Patient Name: Anita ManningBrenda Santana Procedure Date: 07/01/2015 2:11 PM MRN: [id number] Account #: [id number] Date of Birth: Aug 08, 1940 Admit Type: Outpatient Age: 75 [facility] ENDO ROOM 4 Gender: Female Note Status: Finalized Procedure:            Colonoscopy Indications:          Abdominal pain in the left lower quadrant, Follow-up of                        diverticulitis Providers:            Christena DeemMartin U. Skulskie, MD Referring MD:         Dale Durhamharlene Scott, MD (Referring MD) Medicines:            Monitored Anesthesia Care Complications:        No immediate complications. Procedure:            Pre-Anesthesia Assessment:                       - ASA Grade Assessment: III - A patient with severe                        systemic disease.                       After obtaining informed consent, the colonoscope was                        passed under direct vision. Throughout the procedure,                        the patient's blood pressure, pulse, and oxygen                        saturations were monitored continuously. The                        Colonoscope was introduced through the anus with the                        intention of advancing to the cecum. The scope was                        advanced to the sigmoid colon before the procedure was                        aborted. Medications were given. The colonoscopy was                        unusually difficult due to poor bowel prep with stool                        present. The patient tolerated the procedure well. The                        quality of the bowel preparation was poor. Findings:      The digital rectal exam was normal.      Multiple medium-mouthed diverticula were found in the sigmoid colon.      A moderate amount of solid stool was found in the sigmoid colon,  precluding visualization. Impression:           - Preparation of the colon was poor.                       -  Diverticulosis in the sigmoid colon.                       - Stool in the sigmoid colon.                       - No specimens collected. Recommendation:       - Discharge patient to home.                       - reschedule and reprep. Procedure Code(s):    --- Professional ---                       [phone], 53, Colonoscopy, flexible; diagnostic, including                        collection of specimen(s) by brushing or washing, when                        performed (separate procedure) Diagnosis Code(s):    --- Professional ---                       R10.32, Left lower quadrant pain                       K57.32, Diverticulitis of large intestine without                        perforation or abscess without bleeding                       K57.30, Diverticulosis of large intestine without                        perforation or abscess without bleeding CPT copyright 2016 American [employer] Association. All rights reserved. The codes documented in this report are preliminary and upon coder review may  be revised to meet current compliance requirements. Christena DeemMartin U Skulskie, MD 07/01/2015 2:36:11 PM This report has been signed electronically. Number of Addenda: 0 Note Initiated On: 07/01/2015 2:11 PM Total Procedure Duration: 0 hours 9 minutes 9 seconds       [facility]

## 2015-07-01 NOTE — Anesthesia Postprocedure Evaluation (Signed)
Anesthesia Post Note  Patient: Anita Santana  Procedure(s) Performed: Procedure(s) (LRB): COLONOSCOPY WITH PROPOFOL (N/A)  Patient location during evaluation: Endoscopy Anesthesia Type: General Level of consciousness: awake and alert Pain management: pain level controlled Vital Signs Assessment: post-procedure vital signs reviewed and stable Respiratory status: spontaneous breathing, nonlabored ventilation, respiratory function stable and patient connected to nasal cannula oxygen Cardiovascular status: blood pressure returned to baseline and stable Postop Assessment: no signs of nausea or vomiting Anesthetic complications: no    Last Vitals:  Filed Vitals:   07/01/15 1456 07/01/15 1506  BP: 155/68   Pulse: 53 55  Temp:    Resp: 24 16    Last Pain:  Filed Vitals:   07/01/15 1508  PainSc: 4                  Elyse Prevo S

## 2015-07-01 NOTE — Transfer of Care (Signed)
Immediate Anesthesia Transfer of Care Note  Patient: Anita Santana  Procedure(s) Performed: Procedure(s): COLONOSCOPY WITH PROPOFOL (N/A)  Patient Location: PACU  Anesthesia Type:General  Level of Consciousness: awake  Airway & Oxygen Therapy: Patient connected to nasal cannula oxygen  Post-op Assessment: Report given to RN and Post -op Vital signs reviewed and stable  Post vital signs: stable  Last Vitals:  Filed Vitals:   07/01/15 1214  BP: 131/68  Pulse: 71  Temp: 36.8 C  Resp: 16    Last Pain:  Filed Vitals:   07/01/15 1216  PainSc: 4          Complications: No apparent anesthesia complications

## 2015-07-01 NOTE — Anesthesia Preprocedure Evaluation (Signed)
Anesthesia Evaluation  Patient identified by MRN, date of birth, ID band Patient awake    Reviewed: Allergy & Precautions, NPO status , Patient's Chart, lab work & pertinent test results, reviewed documented beta blocker date and time   Airway Mallampati: II  TM Distance: >3 FB     Dental  (+) Chipped   Pulmonary           Cardiovascular hypertension, Pt. on medications      Neuro/Psych TIA   GI/Hepatic GERD  ,  Endo/Other    Renal/GU      Musculoskeletal   Abdominal   Peds  Hematology   Anesthesia Other Findings   Reproductive/Obstetrics                             Anesthesia Physical Anesthesia Plan  ASA: III  Anesthesia Plan: General   Post-op Pain Management:    Induction:   Airway Management Planned: Nasal Cannula  Additional Equipment:   Intra-op Plan:   Post-operative Plan:   Informed Consent: I have reviewed the patients History and Physical, chart, labs and discussed the procedure including the risks, benefits and alternatives for the proposed anesthesia with the patient or authorized representative who has indicated his/her understanding and acceptance.     Plan Discussed with: CRNA  Anesthesia Plan Comments:         Anesthesia Quick Evaluation

## 2015-07-02 ENCOUNTER — Encounter: Payer: Self-pay | Admitting: Gastroenterology

## 2015-07-03 ENCOUNTER — Encounter: Payer: Self-pay | Admitting: *Deleted

## 2015-07-03 ENCOUNTER — Other Ambulatory Visit: Payer: Self-pay | Admitting: Internal Medicine

## 2015-07-03 DIAGNOSIS — Z1231 Encounter for screening mammogram for malignant neoplasm of breast: Secondary | ICD-10-CM

## 2015-07-04 ENCOUNTER — Encounter: Payer: Self-pay | Admitting: *Deleted

## 2015-07-04 ENCOUNTER — Ambulatory Visit: Payer: Commercial Managed Care - HMO | Admitting: Anesthesiology

## 2015-07-04 ENCOUNTER — Ambulatory Visit
Admission: RE | Admit: 2015-07-04 | Discharge: 2015-07-04 | Disposition: A | Discharge status: Home / Self Care | Payer: Commercial Managed Care - HMO | Source: Ambulatory Visit | Attending: Gastroenterology | Admitting: Gastroenterology

## 2015-07-04 ENCOUNTER — Encounter
Admission: RE | Disposition: A | Discharge status: Home / Self Care | Payer: Self-pay | Source: Ambulatory Visit | Attending: Gastroenterology

## 2015-07-04 DIAGNOSIS — M1990 Unspecified osteoarthritis, unspecified site: Secondary | ICD-10-CM | POA: Insufficient documentation

## 2015-07-04 DIAGNOSIS — Z7982 Long term (current) use of aspirin: Secondary | ICD-10-CM | POA: Insufficient documentation

## 2015-07-04 DIAGNOSIS — Z8673 Personal history of transient ischemic attack (TIA), and cerebral infarction without residual deficits: Secondary | ICD-10-CM | POA: Insufficient documentation

## 2015-07-04 DIAGNOSIS — Z79899 Other long term (current) drug therapy: Secondary | ICD-10-CM | POA: Insufficient documentation

## 2015-07-04 DIAGNOSIS — E7800 Pure hypercholesterolemia, unspecified: Secondary | ICD-10-CM | POA: Diagnosis not present

## 2015-07-04 DIAGNOSIS — Z888 Allergy status to other drugs, medicaments and biological substances status: Secondary | ICD-10-CM | POA: Diagnosis not present

## 2015-07-04 DIAGNOSIS — K5730 Diverticulosis of large intestine without perforation or abscess without bleeding: Secondary | ICD-10-CM | POA: Insufficient documentation

## 2015-07-04 DIAGNOSIS — F0390 Unspecified dementia without behavioral disturbance: Secondary | ICD-10-CM | POA: Insufficient documentation

## 2015-07-04 DIAGNOSIS — K5790 Diverticulosis of intestine, part unspecified, without perforation or abscess without bleeding: Secondary | ICD-10-CM | POA: Diagnosis not present

## 2015-07-04 DIAGNOSIS — Z8719 Personal history of other diseases of the digestive system: Secondary | ICD-10-CM | POA: Insufficient documentation

## 2015-07-04 DIAGNOSIS — I10 Essential (primary) hypertension: Secondary | ICD-10-CM | POA: Diagnosis not present

## 2015-07-04 DIAGNOSIS — K219 Gastro-esophageal reflux disease without esophagitis: Secondary | ICD-10-CM | POA: Diagnosis not present

## 2015-07-04 DIAGNOSIS — R1032 Left lower quadrant pain: Secondary | ICD-10-CM | POA: Diagnosis not present

## 2015-07-04 DIAGNOSIS — Z880 Allergy status to penicillin: Secondary | ICD-10-CM | POA: Insufficient documentation

## 2015-07-04 HISTORY — PX: COLONOSCOPY WITH PROPOFOL: SHX5780

## 2015-07-04 HISTORY — DX: Unspecified osteoarthritis, unspecified site: M19.90

## 2015-07-04 HISTORY — DX: Angioneurotic edema, initial encounter: T78.3XXA

## 2015-07-04 HISTORY — DX: Unspecified dementia, unspecified severity, without behavioral disturbance, psychotic disturbance, mood disturbance, and anxiety: F03.90

## 2015-07-04 HISTORY — DX: Psoriasis, unspecified: L40.9

## 2015-07-04 SURGERY — COLONOSCOPY WITH PROPOFOL
Anesthesia: General

## 2015-07-04 MED ORDER — LIDOCAINE 2% (20 MG/ML) 5 ML SYRINGE
INTRAMUSCULAR | Status: DC | PRN
  Administered 2015-07-04: 40 mg via INTRAVENOUS

## 2015-07-04 MED ORDER — FENTANYL CITRATE (PF) 100 MCG/2ML IJ SOLN
INTRAMUSCULAR | Status: DC | PRN
  Administered 2015-07-04: 50 ug via INTRAVENOUS

## 2015-07-04 MED ORDER — PROPOFOL 500 MG/50ML IV EMUL
INTRAVENOUS | Status: DC | PRN
  Administered 2015-07-04: 160 ug/kg/min via INTRAVENOUS

## 2015-07-04 MED ORDER — PROPOFOL 10 MG/ML IV BOLUS
INTRAVENOUS | Status: DC | PRN
  Administered 2015-07-04: 100 mg via INTRAVENOUS

## 2015-07-04 MED ORDER — MIDAZOLAM HCL 5 MG/5ML IJ SOLN
INTRAMUSCULAR | Status: DC | PRN
  Administered 2015-07-04: 1 mg via INTRAVENOUS

## 2015-07-04 MED ORDER — SODIUM CHLORIDE 0.9 % IV SOLN
INTRAVENOUS | Status: DC
  Administered 2015-07-04: 09:00:00 via INTRAVENOUS

## 2015-07-04 NOTE — Anesthesia Preprocedure Evaluation (Signed)
Anesthesia Evaluation  Patient identified by MRN, date of birth, ID band Patient awake    Reviewed: Allergy & Precautions, NPO status , Patient's Chart, lab work & pertinent test results, reviewed documented beta blocker date and time   Airway Mallampati: II  TM Distance: >3 FB     Dental  (+) Chipped   Pulmonary           Cardiovascular hypertension, Pt. on medications + dysrhythmias      Neuro/Psych Anxiety TIA   GI/Hepatic GERD  ,  Endo/Other    Renal/GU      Musculoskeletal  (+) Arthritis ,   Abdominal   Peds  Hematology   Anesthesia Other Findings   Reproductive/Obstetrics                             Anesthesia Physical Anesthesia Plan  ASA: III  Anesthesia Plan: General   Post-op Pain Management:    Induction: Intravenous  Airway Management Planned: Nasal Cannula  Additional Equipment:   Intra-op Plan:   Post-operative Plan:   Informed Consent: I have reviewed the patients History and Physical, chart, labs and discussed the procedure including the risks, benefits and alternatives for the proposed anesthesia with the patient or authorized representative who has indicated his/her understanding and acceptance.     Plan Discussed with: CRNA  Anesthesia Plan Comments:         Anesthesia Quick Evaluation

## 2015-07-04 NOTE — Transfer of Care (Signed)
Immediate Anesthesia Transfer of Care Note  Patient: Anita Santana  Procedure(s) Performed: Procedure(s): COLONOSCOPY WITH PROPOFOL (N/A)  Patient Location: Endoscopy Unit  Anesthesia Type:General  Level of Consciousness: sedated  Airway & Oxygen Therapy: Patient Spontanous Breathing and Patient connected to nasal cannula oxygen  Post-op Assessment: Report given to RN and Post -op Vital signs reviewed and stable  Post vital signs: Reviewed  Last Vitals:  Filed Vitals:   07/04/15 0805 07/04/15 0936  BP: 156/77 102/46  Pulse: 55 57  Temp: 36.4 C 36.4 C  Resp: 14 12    Last Pain: There were no vitals filed for this visit.       Complications: No apparent anesthesia complications

## 2015-07-04 NOTE — Op Note (Signed)
[facility] Gastroenterology Patient Name: Anita ManningBrenda Santana Procedure Date: 07/04/2015 9:05 AM MRN: [id number] Account #: [id number] Date of Birth: 27-May-1940 Admit Type: Outpatient Age: [age] [facility] Gender: Female Note Status: Finalized Procedure:            Colonoscopy Indications:          Abdominal pain in the left lower quadrant, Follow-up of                        diverticulitis Providers:            Christena DeemMartin U. Dickson Kostelnik, MD Referring MD:         Dale Durhamharlene Scott, MD (Referring MD) Medicines:            Monitored Anesthesia Care Complications:        No immediate complications. Procedure:            Pre-Anesthesia Assessment:                       - ASA Grade Assessment: III - A patient with severe                        systemic disease.                       After obtaining informed consent, the colonoscope was                        passed under direct vision. Throughout the procedure,                        the patient's blood pressure, pulse, and oxygen                        saturations were monitored continuously. The                        Colonoscope was introduced through the anus and                        advanced to the the cecum, identified by appendiceal                        orifice and ileocecal valve. The colonoscopy was                        performed without difficulty. The patient tolerated the                        procedure well. The quality of the bowel preparation                        was good. Findings:      Multiple small and large-mouthed diverticula were found in the sigmoid       colon and distal descending colon.      The exam was otherwise normal throughout the examined colon.      The retroflexed view of the distal rectum and anal verge was normal and       showed no anal or rectal abnormalities.      The digital rectal exam was normal. Impression:           -  Diverticulosis in the sigmoid colon and in the  distal                        descending colon.                       - The distal rectum and anal verge are normal on                        retroflexion view.                       - No specimens collected. Recommendation:       - Use Citrucel one tablespoon PO daily.                       - Return to GI clinic in 6 weeks. Procedure Code(s):    --- Professional ---                       [id number], Colonoscopy, flexible; diagnostic, including                        collection of specimen(s) by brushing or washing, when                        performed (separate procedure) Diagnosis Code(s):    --- Professional ---                       R10.32, Left lower quadrant pain                       K57.32, Diverticulitis of large intestine without                        perforation or abscess without bleeding                       K57.30, Diverticulosis of large intestine without                        perforation or abscess without bleeding CPT copyright 2016 American Medical Association. All rights reserved. The codes documented in this report are preliminary and upon coder review may  be revised to meet current compliance requirements. Christena DeemMartin U Devlon Dosher, MD 07/04/2015 9:33:32 AM This report has been signed electronically. Number of Addenda: 0 Note Initiated On: 07/04/2015 9:05 AM Scope Withdrawal Time: 0 hours 10 minutes 40 seconds  Total Procedure Duration: 0 hours 20 minutes 48 seconds       [facility]

## 2015-07-04 NOTE — Anesthesia Postprocedure Evaluation (Signed)
Anesthesia Post Note  Patient: Anita Santana  Procedure(s) Performed: Procedure(s) (LRB): COLONOSCOPY WITH PROPOFOL (N/A)  Patient location during evaluation: Endoscopy Anesthesia Type: General Level of consciousness: awake and alert Pain management: pain level controlled Vital Signs Assessment: post-procedure vital signs reviewed and stable Respiratory status: spontaneous breathing, nonlabored ventilation, respiratory function stable and patient connected to nasal cannula oxygen Cardiovascular status: blood pressure returned to baseline and stable Postop Assessment: no signs of nausea or vomiting Anesthetic complications: no    Last Vitals:  Filed Vitals:   07/04/15 0935 07/04/15 0936  BP: 102/46 102/46  Pulse: 59 57  Temp: 35.9 C 36.4 C  Resp: 6 12    Last Pain: There were no vitals filed for this visit.               Jacolyn Joaquin S

## 2015-07-04 NOTE — H&P (Addendum)
Outpatient short stay form Pre-procedure 07/04/2015 8:17 AM Christena DeemMartin U Eriyana Sweeten MD  Primary Physician: Dr. Dale Durhamharlene Scott  Reason for visit:  Colonoscopy  History of present illness:  Patient is a 75 year old female presenting in follow-up a recent bout of diverticulitis. She is been having some chronic left lower quadrant pain. She presented 2 days ago however prep was not adequate for procedure. She is been reprepped and is re-presenting. She's had multiple episodes of diverticulitis that has been treated since this past January, about 6 months ago. She also has a previous history of diverticulitis in the past. She currently takes no aspirin products or blood thinning agents.    Current facility-administered medications:  .  0.9 %  sodium chloride infusion, , Intravenous, Continuous, Christena DeemMartin U Nirvana Blanchett, MD  Prescriptions prior to admission  Medication Sig Dispense Refill Last Dose  . Calcium Carbonate-Vitamin D (CALCIUM 600+D) 600-200 MG-UNIT TABS Take 1 tablet by mouth daily.   Past Week at Unknown time  . galantamine (RAZADYNE) 8 MG tablet Reported on 07/01/2015   Past Week at Unknown time  . hydrochlorothiazide (HYDRODIURIL) 25 MG tablet TAKE 1/2 TABLET EVERY DAY 45 tablet 1 07/03/2015 at Unknown time  . lisinopril (PRINIVIL,ZESTRIL) 40 MG tablet TAKE 1 TABLET EVERY DAY 90 tablet 1 07/03/2015 at Unknown time  . oxyCODONE-acetaminophen (ROXICET) 5-325 MG tablet Take 1 tablet by mouth every 6 (six) hours as needed for severe pain. 12 tablet 0 Past Month at Unknown time  . Psyllium (METAMUCIL PO) Take by mouth daily. Reported on 01/24/2015   Past Month at Unknown time  . ranitidine (ZANTAC) 150 MG tablet Take 150 mg by mouth as needed.   Past Week at Unknown time  . acetaminophen (TYLENOL ARTHRITIS PAIN) 650 MG CR tablet Take 650 mg by mouth as needed. Reported on 01/24/2015   Taking  . aspirin 81 MG tablet Take 81 mg by mouth daily.   06/27/2015  . atorvastatin (LIPITOR) 20 MG tablet Take 1 tablet  (20 mg total) by mouth daily. NEED APPT FOR ADDITIONAL REFILLS (Patient not taking: Reported on 07/04/2015) 90 tablet 0 Not Taking at Unknown time  . ciprofloxacin (CIPRO) 500 MG tablet Take 1 tablet (500 mg total) by mouth 2 (two) times daily. (Patient not taking: Reported on 07/01/2015) 14 tablet 0 Completed Course at Unknown time  . dicyclomine (BENTYL) 20 MG tablet Take 1 tablet (20 mg total) by mouth 3 (three) times daily as needed (abdominal pain). (Patient not taking: Reported on 07/01/2015) 30 tablet 0 Completed Course at Unknown time  . metroNIDAZOLE (FLAGYL) 500 MG tablet Take 1 tablet (500 mg total) by mouth 3 (three) times daily. (Patient not taking: Reported on 07/01/2015) 30 tablet 0 Completed Course at Unknown time  . naproxen (NAPROSYN) 500 MG tablet Take 1 tablet (500 mg total) by mouth 2 (two) times daily with a meal. 60 tablet 0 Taking  . ondansetron (ZOFRAN ODT) 4 MG disintegrating tablet Take 1 tablet (4 mg total) by mouth every 8 (eight) hours as needed for nausea or vomiting. 20 tablet 0   . pantoprazole (PROTONIX) 40 MG tablet TAKE 1 TABLET EVERY DAY (Patient not taking: Reported on 07/01/2015) 90 tablet 1 Not Taking at Unknown time  . simethicone (GAS-X) 80 MG chewable tablet Chew 80 mg by mouth as needed.   Taking  . traMADol (ULTRAM) 50 MG tablet Take by mouth every 6 (six) hours as needed. Reported on 07/04/2015   Not Taking at Unknown time  Allergies  Allergen Reactions  . Plavix [Clopidogrel Bisulfate] Hives and Swelling  . Penicillins Rash     Past Medical History  Diagnosis Date  . GERD (gastroesophageal reflux disease)   . Hypertension   . Hypercholesteremia   . TIA (transient ischemic attack)   . Syncope and collapse   . Diverticulosis of colon 02/08/12    Colonoscopy  . Dementia   . Arthritis   . Psoriasis   . Angioedema     Review of systems:      Physical Exam    Heart and lungs: Regular rate and rhythm without rub or gallop, lungs are  bilaterally clear.    HEENT: Norm cephalic atraumatic eyes are anicteric    Other:     Pertinant exam for procedure: Soft nontender nondistended bowel sounds positive and normoactive.    Planned proceedures: Colonoscopy and indicated procedures. I have discussed the risks benefits and complications of procedures to include not limited to bleeding, infection, perforation and the risk of sedation and the patient wishes to proceed.    Christena DeemMartin U Dequandre Cordova, MD Gastroenterology 07/04/2015  8:17 AM

## 2015-07-05 ENCOUNTER — Encounter: Payer: Self-pay | Admitting: Gastroenterology

## 2015-07-16 DIAGNOSIS — R1032 Left lower quadrant pain: Secondary | ICD-10-CM | POA: Diagnosis not present

## 2015-07-16 DIAGNOSIS — I10 Essential (primary) hypertension: Secondary | ICD-10-CM | POA: Diagnosis not present

## 2015-07-16 DIAGNOSIS — K5792 Diverticulitis of intestine, part unspecified, without perforation or abscess without bleeding: Secondary | ICD-10-CM | POA: Diagnosis not present

## 2015-07-18 DIAGNOSIS — F0280 Dementia in other diseases classified elsewhere without behavioral disturbance: Secondary | ICD-10-CM | POA: Diagnosis not present

## 2015-07-18 DIAGNOSIS — G301 Alzheimer's disease with late onset: Secondary | ICD-10-CM | POA: Diagnosis not present

## 2015-07-24 ENCOUNTER — Ambulatory Visit
Admission: RE | Admit: 2015-07-24 | Discharge: 2015-07-24 | Disposition: A | Discharge status: Home / Self Care | Payer: Commercial Managed Care - HMO | Source: Ambulatory Visit | Attending: Internal Medicine | Admitting: Internal Medicine

## 2015-07-24 DIAGNOSIS — Z1231 Encounter for screening mammogram for malignant neoplasm of breast: Secondary | ICD-10-CM | POA: Diagnosis not present

## 2015-09-10 DIAGNOSIS — G8929 Other chronic pain: Secondary | ICD-10-CM | POA: Diagnosis not present

## 2015-09-10 DIAGNOSIS — K5909 Other constipation: Secondary | ICD-10-CM | POA: Diagnosis not present

## 2015-09-10 DIAGNOSIS — R1032 Left lower quadrant pain: Secondary | ICD-10-CM | POA: Diagnosis not present

## 2015-09-27 ENCOUNTER — Other Ambulatory Visit: Payer: Self-pay | Admitting: Internal Medicine

## 2015-09-30 ENCOUNTER — Other Ambulatory Visit: Payer: Self-pay | Admitting: Gastroenterology

## 2015-09-30 ENCOUNTER — Ambulatory Visit
Admission: RE | Admit: 2015-09-30 | Discharge: 2015-09-30 | Disposition: A | Discharge status: Home / Self Care | Payer: Commercial Managed Care - HMO | Source: Ambulatory Visit | Attending: Gastroenterology | Admitting: Gastroenterology

## 2015-09-30 DIAGNOSIS — M47816 Spondylosis without myelopathy or radiculopathy, lumbar region: Secondary | ICD-10-CM | POA: Insufficient documentation

## 2015-09-30 DIAGNOSIS — R109 Unspecified abdominal pain: Secondary | ICD-10-CM

## 2015-10-01 ENCOUNTER — Telehealth: Payer: Self-pay | Admitting: Internal Medicine

## 2015-10-01 NOTE — Telephone Encounter (Signed)
[facility] wanted to speak with you in regards to pts labs. Her phone number is [phone].

## 2015-10-02 NOTE — Telephone Encounter (Signed)
Called and talked with Thrivent FinancialChristiane London.  Pt with back pain.  Appears to be msk.  ok'd referral to Dr Yves Dillhasnis.

## 2015-10-31 ENCOUNTER — Encounter (INDEPENDENT_AMBULATORY_CARE_PROVIDER_SITE_OTHER): Payer: Self-pay

## 2015-10-31 ENCOUNTER — Ambulatory Visit (INDEPENDENT_AMBULATORY_CARE_PROVIDER_SITE_OTHER): Payer: Commercial Managed Care - HMO | Admitting: Internal Medicine

## 2015-10-31 ENCOUNTER — Encounter: Payer: Self-pay | Admitting: Internal Medicine

## 2015-10-31 VITALS — BP 132/80 | HR 58 | Temp 98.3°F | Ht 66.0 in | Wt 133.6 lb

## 2015-10-31 DIAGNOSIS — R739 Hyperglycemia, unspecified: Secondary | ICD-10-CM | POA: Diagnosis not present

## 2015-10-31 DIAGNOSIS — R1032 Left lower quadrant pain: Secondary | ICD-10-CM

## 2015-10-31 DIAGNOSIS — M545 Low back pain, unspecified: Secondary | ICD-10-CM

## 2015-10-31 DIAGNOSIS — K5730 Diverticulosis of large intestine without perforation or abscess without bleeding: Secondary | ICD-10-CM

## 2015-10-31 DIAGNOSIS — Z23 Encounter for immunization: Secondary | ICD-10-CM

## 2015-10-31 DIAGNOSIS — R413 Other amnesia: Secondary | ICD-10-CM

## 2015-10-31 DIAGNOSIS — K219 Gastro-esophageal reflux disease without esophagitis: Secondary | ICD-10-CM

## 2015-10-31 DIAGNOSIS — G8929 Other chronic pain: Secondary | ICD-10-CM

## 2015-10-31 DIAGNOSIS — I10 Essential (primary) hypertension: Secondary | ICD-10-CM

## 2015-10-31 DIAGNOSIS — E7800 Pure hypercholesterolemia, unspecified: Secondary | ICD-10-CM | POA: Diagnosis not present

## 2015-10-31 LAB — BASIC METABOLIC PANEL
BUN: 21 mg/dL (ref 6–23)
CHLORIDE: 102 meq/L (ref 96–112)
CO2: 31 mEq/L (ref 19–32)
CREATININE: 0.79 mg/dL (ref 0.40–1.20)
Calcium: 9.9 mg/dL (ref 8.4–10.5)
GFR: 75.41 mL/min (ref >60.00)
GLUCOSE: 84 mg/dL (ref 70–99)
POTASSIUM: 3.8 meq/L (ref 3.5–5.1)
Sodium: 140 mEq/L (ref 135–145)

## 2015-10-31 LAB — HEPATIC FUNCTION PANEL
ALT: 21 U/L (ref 0–35)
AST: 22 U/L (ref 0–37)
Albumin: 4.5 g/dL (ref 3.5–5.2)
Alkaline Phosphatase: 77 U/L (ref 39–117)
BILIRUBIN DIRECT: 0.1 mg/dL (ref 0.0–0.3)
BILIRUBIN TOTAL: 0.6 mg/dL (ref 0.2–1.2)
Total Protein: 7.3 g/dL (ref 6.0–8.3)

## 2015-10-31 LAB — LIPID PANEL
CHOL/HDL RATIO: 3
CHOLESTEROL: 174 mg/dL (ref 0–200)
HDL: 68.9 mg/dL (ref >39.00)
LDL CALC: 86 mg/dL (ref 0–99)
NONHDL: 104.71
Triglycerides: 92 mg/dL (ref 0.0–149.0)
VLDL: 18.4 mg/dL (ref 0.0–40.0)

## 2015-10-31 LAB — HEMOGLOBIN A1C: Hgb A1c MFr Bld: 5.5 % (ref 4.6–6.5)

## 2015-10-31 NOTE — Progress Notes (Signed)
Pre visit review using our clinic review tool, if applicable. No additional management support is needed unless otherwise documented below in the visit note. 

## 2015-10-31 NOTE — Progress Notes (Signed)
Patient ID: LIVIANA MILLS, female   DOB: 29-Oct-1940, 75 y.o.   MRN: [id number]   Subjective:    Patient ID: TEANDRA HARLAN, female    DOB: 08-31-1940, 75 y.o.   MRN: [id number]  HPI  Patient here for a scheduled follow up.  She is accompanied by her husband.  History obtained from both of them.  No increased abdominal pain.  Better.  Bowels doing better on citrucel.  Goes outside and works in her yard.  No significant pain.  Some pain with activity.  Had xray through GI.  Referred to Dr Sharlet Salina.  Due to see him next week.  No chest pain.  No sob.  Memory changes progressing.  Seeing neurology.     Past Medical History:  Diagnosis Date  . Angioedema   . Arthritis   . Dementia   . Diverticulosis of colon 02/08/12   Colonoscopy  . GERD (gastroesophageal reflux disease)   . Hypercholesteremia   . Hypertension   . Psoriasis   . Syncope and collapse   . TIA (transient ischemic attack)    Past Surgical History:  Procedure Laterality Date  . cataract surgery  2012  . COLONOSCOPY WITH PROPOFOL N/A 07/01/2015   Procedure: COLONOSCOPY WITH PROPOFOL;  Surgeon: Lollie Sails, MD;  Location: [facility] ENDOSCOPY;  Service: Endoscopy;  Laterality: N/A;  . COLONOSCOPY WITH PROPOFOL N/A 07/04/2015   Procedure: COLONOSCOPY WITH PROPOFOL;  Surgeon: Lollie Sails, MD;  Location: [facility] ENDOSCOPY;  Service: Endoscopy;  Laterality: N/A;  . EYE SURGERY    . TONSILECTOMY/ADENOIDECTOMY WITH MYRINGOTOMY  1950  . TONSILLECTOMY     Family History  Problem Relation Age of Onset  . Hypercholesterolemia Mother   . Stroke Mother   . Hypertension Mother   . Cancer Mother     ovarian/uterus  . Hypercholesterolemia Father   . Heart disease Father   . Heart attack Father   . Cancer Maternal Grandmother     unsure of what type   Social History   Social History  . Marital status: Married    Spouse name: N/A  . Number of children: N/A  . Years of education: N/A   Social History Main Topics  . Smoking  status: Never Smoker  . Smokeless tobacco: Never Used  . Alcohol use No  . Drug use: No  . Sexual activity: Not Currently   Other Topics Concern  . None   Social History Narrative  . None    Outpatient Encounter Prescriptions as of 10/31/2015  Medication Sig  . acetaminophen (TYLENOL ARTHRITIS PAIN) 650 MG CR tablet Take 650 mg by mouth as needed. Reported on 01/24/2015  . aspirin 81 MG tablet Take 81 mg by mouth daily.  Marland Kitchen atorvastatin (LIPITOR) 20 MG tablet TAKE 1 TABLET EVERY DAY (NEED APPT FOR ADDITIONAL REFILLS)  . Calcium Carbonate-Vitamin D (CALCIUM 600+D) 600-200 MG-UNIT TABS Take 1 tablet by mouth daily.  Marland Kitchen dicyclomine (BENTYL) 20 MG tablet Take 1 tablet (20 mg total) by mouth 3 (three) times daily as needed (abdominal pain).  Marland Kitchen galantamine (RAZADYNE) 8 MG tablet Reported on 07/01/2015  . hydrochlorothiazide (HYDRODIURIL) 25 MG tablet TAKE 1/2 TABLET EVERY DAY  . lisinopril (PRINIVIL,ZESTRIL) 40 MG tablet TAKE 1 TABLET EVERY DAY  . pantoprazole (PROTONIX) 40 MG tablet TAKE 1 TABLET EVERY DAY  . [DISCONTINUED] ciprofloxacin (CIPRO) 500 MG tablet Take 1 tablet (500 mg total) by mouth 2 (two) times daily. (Patient not taking: Reported on 07/01/2015)  . [DISCONTINUED] metroNIDAZOLE (  FLAGYL) 500 MG tablet Take 1 tablet (500 mg total) by mouth 3 (three) times daily. (Patient not taking: Reported on 07/01/2015)  . [DISCONTINUED] naproxen (NAPROSYN) 500 MG tablet Take 1 tablet (500 mg total) by mouth 2 (two) times daily with a meal.  . [DISCONTINUED] ondansetron (ZOFRAN ODT) 4 MG disintegrating tablet Take 1 tablet (4 mg total) by mouth every 8 (eight) hours as needed for nausea or vomiting.  . [DISCONTINUED] oxyCODONE-acetaminophen (ROXICET) 5-325 MG tablet Take 1 tablet by mouth every 6 (six) hours as needed for severe pain.  . [DISCONTINUED] Psyllium (METAMUCIL PO) Take by mouth daily. Reported on 01/24/2015  . [DISCONTINUED] ranitidine (ZANTAC) 150 MG tablet Take 150 mg by mouth as  needed.  . [DISCONTINUED] simethicone (GAS-X) 80 MG chewable tablet Chew 80 mg by mouth as needed.  . [DISCONTINUED] traMADol (ULTRAM) 50 MG tablet Take by mouth every 6 (six) hours as needed. Reported on 07/04/2015   No facility-administered encounter medications on file as of 10/31/2015.     Review of Systems  Constitutional: Negative for appetite change and unexpected weight change.  HENT: Negative for congestion and sinus pressure.   Respiratory: Negative for cough, chest tightness and shortness of breath.   Cardiovascular: Negative for chest pain, palpitations and leg swelling.  Gastrointestinal: Negative for abdominal pain, diarrhea, nausea and vomiting.  Musculoskeletal: Positive for back pain. Negative for joint swelling.  Skin: Negative for color change and rash.  Neurological: Negative for dizziness, light-headedness and headaches.  Psychiatric/Behavioral: Negative for agitation and dysphoric mood.       Objective:     Blood pressure rechecked by me:  132/80  Physical Exam  Constitutional: She appears well-developed and well-nourished. No distress.  HENT:  Nose: Nose normal.  Mouth/Throat: Oropharynx is clear and moist.  Neck: Neck supple. No thyromegaly present.  Cardiovascular: Normal rate and regular rhythm.   Pulmonary/Chest: Breath sounds normal. No respiratory distress. She has no wheezes.  Abdominal: Soft. Bowel sounds are normal. There is no tenderness.  Musculoskeletal: She exhibits no edema or tenderness.  Lymphadenopathy:    She has no cervical adenopathy.  Skin: No rash noted. No erythema.  Psychiatric: She has a normal mood and affect. Her behavior is normal.    BP 132/80   Pulse (!) 58   Temp 98.3 F (36.8 C) (Oral)   Ht '5\' 6"'  (1.676 m)   Wt 133 lb 9.6 oz (60.6 kg)   SpO2 97%   BMI 21.56 kg/m  Wt Readings from Last 3 Encounters:  10/31/15 133 lb 9.6 oz (60.6 kg)  07/04/15 144 lb (65.3 kg)  07/01/15 144 lb (65.3 kg)     Lab Results    Component Value Date   WBC 8.0 05/01/2015   HGB 12.4 05/01/2015   HCT 36.2 05/01/2015   PLT 287 05/01/2015   GLUCOSE 98 05/01/2015   CHOL 164 09/07/2014   TRIG 123.0 09/07/2014   HDL 55.20 09/07/2014   LDLDIRECT 237.0 07/15/2012   LDLCALC 84 09/07/2014   ALT 18 05/01/2015   AST 22 05/01/2015   NA 140 05/01/2015   K 3.6 05/01/2015   CL 107 05/01/2015   CREATININE 0.78 05/01/2015   BUN 12 05/01/2015   CO2 23 05/01/2015   TSH 1.90 06/05/2014   HGBA1C 5.4 09/07/2014    Dg Lumbar Spine 2-3 Views  Result Date: 09/30/2015 CLINICAL DATA:  75 y/o  F; 2 weeks of left-sided flank pain. EXAM: LUMBAR SPINE - 2-3 VIEW COMPARISON:  05/02/2015 lumbar radiographs.  FINDINGS: Six lumbar type non-rib-bearing vertebral bodies. Twelve paired ribs on prior chest radiograph. Transitional S1 vertebral body. Moderate levocurvature with apex at L3. No loss of vertebral body height. No fracture. Multilevel severe disc space narrowing greatest from L3 through S1. Lower lumbar facet arthrosis. No listhesis. IMPRESSION: No acute fracture or dislocation. Moderate lumbar levo curvature. Moderate to severe lumbar spine degenerative changes. No significant interval change. Electronically Signed   By: Kristine Garbe M.D.   On: 09/30/2015 16:24       Assessment & Plan:   Problem List Items Addressed This Visit    Back pain    Some previous back pain noticed.  Had xray through GI.  Has been referred to Dr Sharlet Salina.  Has upcoming appt.        Diverticulosis of colon without hemorrhage    Colonoscopy 07/04/15 as outlined.  Bowels are doing better.  Taking citrucel.  Follow.        GERD (gastroesophageal reflux disease)    On protonix.  Currently asymptomatic.        Hypercholesteremia    On lipitor.  Low cholesterol diet and exercise.  Follow lipid panel and liver function tests.        Relevant Orders   Lipid panel   Hepatic function panel   Hyperglycemia    Low carb diet and exercise.  Follow  met b and a1c.       Relevant Orders   Hemoglobin A1c   Hypertension - Primary    Blood pressure overall ok.  Continue current medication regimen.  Follow pressures and follow metabolic panel.        Relevant Orders   Basic metabolic panel   TSH   LLQ abdominal pain    Improved.  Bowels doing better.  Taking citrucel.        Memory change    Followed by Dr Manuella Ghazi.  Husband stays with her.  Continue f/u with neurology.        Other Visit Diagnoses    Encounter for immunization       Relevant Orders   Flu vaccine HIGH DOSE PF (Completed)       Einar Pheasant, MD

## 2015-11-02 ENCOUNTER — Encounter: Payer: Self-pay | Admitting: Internal Medicine

## 2015-11-02 DIAGNOSIS — M549 Dorsalgia, unspecified: Secondary | ICD-10-CM | POA: Insufficient documentation

## 2015-11-02 NOTE — Assessment & Plan Note (Signed)
Low carb diet and exercise.  Follow met b and a1c.  

## 2015-11-02 NOTE — Assessment & Plan Note (Signed)
On protonix.  Currently asymptomatic.    

## 2015-11-02 NOTE — Assessment & Plan Note (Signed)
Colonoscopy 07/04/15 as outlined.  Bowels are doing better.  Taking citrucel.  Follow.

## 2015-11-02 NOTE — Assessment & Plan Note (Signed)
Some previous back pain noticed.  Had xray through GI.  Has been referred to Dr Yves Dillhasnis.  Has upcoming appt.

## 2015-11-02 NOTE — Assessment & Plan Note (Signed)
Followed by Dr Sherryll BurgerShah.  Husband stays with her.  Continue f/u with neurology.

## 2015-11-02 NOTE — Assessment & Plan Note (Signed)
Blood pressure overall ok.  Continue current medication regimen.  Follow pressures and follow metabolic panel.

## 2015-11-02 NOTE — Assessment & Plan Note (Signed)
Improved.  Bowels doing better.  Taking citrucel.

## 2015-11-02 NOTE — Assessment & Plan Note (Signed)
On lipitor.  Low cholesterol diet and exercise.  Follow lipid panel and liver function tests.   

## 2015-11-04 DIAGNOSIS — M5416 Radiculopathy, lumbar region: Secondary | ICD-10-CM | POA: Diagnosis not present

## 2015-11-04 DIAGNOSIS — M5136 Other intervertebral disc degeneration, lumbar region: Secondary | ICD-10-CM | POA: Diagnosis not present

## 2015-11-04 LAB — TSH: TSH: 1.19 u[IU]/mL (ref 0.35–4.50)

## 2016-01-24 ENCOUNTER — Ambulatory Visit: Payer: Commercial Managed Care - HMO

## 2016-01-31 ENCOUNTER — Other Ambulatory Visit: Payer: Self-pay | Admitting: Physical Medicine and Rehabilitation

## 2016-01-31 DIAGNOSIS — M5416 Radiculopathy, lumbar region: Secondary | ICD-10-CM

## 2016-01-31 DIAGNOSIS — M5136 Other intervertebral disc degeneration, lumbar region: Secondary | ICD-10-CM | POA: Diagnosis not present

## 2016-02-07 ENCOUNTER — Ambulatory Visit: Payer: Commercial Managed Care - HMO

## 2016-02-12 ENCOUNTER — Other Ambulatory Visit: Payer: Self-pay | Admitting: Internal Medicine

## 2016-02-18 ENCOUNTER — Other Ambulatory Visit: Payer: Self-pay | Admitting: Internal Medicine

## 2016-02-19 ENCOUNTER — Ambulatory Visit
Admission: RE | Admit: 2016-02-19 | Discharge: 2016-02-19 | Disposition: A | Discharge status: Home / Self Care | Payer: Commercial Managed Care - HMO | Source: Ambulatory Visit | Attending: Physical Medicine and Rehabilitation | Admitting: Physical Medicine and Rehabilitation

## 2016-02-19 DIAGNOSIS — M5416 Radiculopathy, lumbar region: Secondary | ICD-10-CM | POA: Insufficient documentation

## 2016-02-19 DIAGNOSIS — M4186 Other forms of scoliosis, lumbar region: Secondary | ICD-10-CM | POA: Diagnosis not present

## 2016-02-19 DIAGNOSIS — M48061 Spinal stenosis, lumbar region without neurogenic claudication: Secondary | ICD-10-CM | POA: Insufficient documentation

## 2016-02-19 DIAGNOSIS — M4804 Spinal stenosis, thoracic region: Secondary | ICD-10-CM | POA: Diagnosis not present

## 2016-02-19 DIAGNOSIS — M545 Low back pain: Secondary | ICD-10-CM | POA: Diagnosis not present

## 2016-02-27 DIAGNOSIS — F0280 Dementia in other diseases classified elsewhere without behavioral disturbance: Secondary | ICD-10-CM | POA: Diagnosis not present

## 2016-02-27 DIAGNOSIS — G301 Alzheimer's disease with late onset: Secondary | ICD-10-CM | POA: Diagnosis not present

## 2016-03-11 DIAGNOSIS — M5136 Other intervertebral disc degeneration, lumbar region: Secondary | ICD-10-CM | POA: Diagnosis not present

## 2016-03-11 DIAGNOSIS — M5416 Radiculopathy, lumbar region: Secondary | ICD-10-CM | POA: Diagnosis not present

## 2016-04-25 ENCOUNTER — Other Ambulatory Visit: Payer: Self-pay | Admitting: Internal Medicine

## 2016-04-30 ENCOUNTER — Telehealth: Payer: Self-pay | Admitting: Internal Medicine

## 2016-04-30 NOTE — Telephone Encounter (Signed)
Pt spouse called and stated that they would like to make an appt with Dr. Lorin Picket. Pt spouse states that pt is having back pain that is going down her side. Pt has seen Dr. Juliette Alcide and he gave her a shot. They states that shot has not helped and wanted to know if they could have blood work done to see if that shows anything. Please advise, thank you  Call pt @ [phone] or [phone]

## 2016-04-30 NOTE — Telephone Encounter (Signed)
Spoke to patient husband informed him that Dr. Lorin Picket could not order blood work without doing exam. I encouraged them to Call Dr. Yves Dill office to see if he can officer some advise on how to proceed with her back pain treatment.

## 2016-05-01 NOTE — Telephone Encounter (Signed)
Agree with evaluation if persistent back pain.  Can send to [facility] for cancellation or work in appt, but if more acute symptoms, will need to be evaluated earlier - (work in appt with someone).

## 2016-05-04 DIAGNOSIS — M7062 Trochanteric bursitis, left hip: Secondary | ICD-10-CM | POA: Diagnosis not present

## 2016-05-04 DIAGNOSIS — M5416 Radiculopathy, lumbar region: Secondary | ICD-10-CM | POA: Diagnosis not present

## 2016-05-04 DIAGNOSIS — M5136 Other intervertebral disc degeneration, lumbar region: Secondary | ICD-10-CM | POA: Diagnosis not present

## 2016-06-24 ENCOUNTER — Other Ambulatory Visit: Payer: Self-pay | Admitting: Internal Medicine

## 2016-07-03 DIAGNOSIS — M62830 Muscle spasm of back: Secondary | ICD-10-CM | POA: Diagnosis not present

## 2016-07-03 DIAGNOSIS — M5416 Radiculopathy, lumbar region: Secondary | ICD-10-CM | POA: Diagnosis not present

## 2016-07-03 DIAGNOSIS — M5136 Other intervertebral disc degeneration, lumbar region: Secondary | ICD-10-CM | POA: Diagnosis not present

## 2016-07-03 DIAGNOSIS — M7062 Trochanteric bursitis, left hip: Secondary | ICD-10-CM | POA: Diagnosis not present

## 2016-07-16 ENCOUNTER — Telehealth: Payer: Self-pay | Admitting: Internal Medicine

## 2016-07-16 NOTE — Telephone Encounter (Signed)
Left pt message asking to call Revonda Standardllison back directly at [phone] to schedule AWV. Thanks!  Note* Last AWV 01/24/15

## 2016-07-16 NOTE — Telephone Encounter (Signed)
Pt spouse would like me to call him back in a few weeks to schedule AWV

## 2016-08-11 ENCOUNTER — Other Ambulatory Visit: Payer: Self-pay | Admitting: Internal Medicine

## 2016-08-13 DIAGNOSIS — F0150 Vascular dementia without behavioral disturbance: Secondary | ICD-10-CM | POA: Diagnosis not present

## 2016-08-13 DIAGNOSIS — Z8639 Personal history of other endocrine, nutritional and metabolic disease: Secondary | ICD-10-CM | POA: Diagnosis not present

## 2016-08-13 DIAGNOSIS — R634 Abnormal weight loss: Secondary | ICD-10-CM | POA: Diagnosis not present

## 2016-08-13 DIAGNOSIS — F0280 Dementia in other diseases classified elsewhere without behavioral disturbance: Secondary | ICD-10-CM | POA: Diagnosis not present

## 2016-08-13 DIAGNOSIS — R252 Cramp and spasm: Secondary | ICD-10-CM | POA: Diagnosis not present

## 2016-08-13 DIAGNOSIS — G309 Alzheimer's disease, unspecified: Secondary | ICD-10-CM | POA: Diagnosis not present

## 2016-08-17 ENCOUNTER — Encounter: Payer: Self-pay | Admitting: Internal Medicine

## 2016-08-17 ENCOUNTER — Ambulatory Visit (INDEPENDENT_AMBULATORY_CARE_PROVIDER_SITE_OTHER): Payer: Commercial Managed Care - HMO | Admitting: Internal Medicine

## 2016-08-17 VITALS — BP 138/78 | HR 60 | Temp 98.4°F | Resp 12 | Ht 66.0 in | Wt 124.8 lb

## 2016-08-17 DIAGNOSIS — G309 Alzheimer's disease, unspecified: Secondary | ICD-10-CM

## 2016-08-17 DIAGNOSIS — I10 Essential (primary) hypertension: Secondary | ICD-10-CM

## 2016-08-17 DIAGNOSIS — R42 Dizziness and giddiness: Secondary | ICD-10-CM

## 2016-08-17 DIAGNOSIS — R634 Abnormal weight loss: Secondary | ICD-10-CM | POA: Diagnosis not present

## 2016-08-17 DIAGNOSIS — Z1231 Encounter for screening mammogram for malignant neoplasm of breast: Secondary | ICD-10-CM | POA: Diagnosis not present

## 2016-08-17 DIAGNOSIS — K219 Gastro-esophageal reflux disease without esophagitis: Secondary | ICD-10-CM | POA: Diagnosis not present

## 2016-08-17 DIAGNOSIS — Z1239 Encounter for other screening for malignant neoplasm of breast: Secondary | ICD-10-CM

## 2016-08-17 DIAGNOSIS — E7800 Pure hypercholesterolemia, unspecified: Secondary | ICD-10-CM | POA: Diagnosis not present

## 2016-08-17 DIAGNOSIS — F0150 Vascular dementia without behavioral disturbance: Secondary | ICD-10-CM | POA: Diagnosis not present

## 2016-08-17 DIAGNOSIS — F0280 Dementia in other diseases classified elsewhere without behavioral disturbance: Secondary | ICD-10-CM | POA: Diagnosis not present

## 2016-08-17 DIAGNOSIS — R739 Hyperglycemia, unspecified: Secondary | ICD-10-CM | POA: Diagnosis not present

## 2016-08-17 NOTE — Progress Notes (Signed)
Patient ID: Anita Santana, female   DOB: March 10, 1940, 76 y.o.   MRN: [id number]   Subjective:    Patient ID: Anita Santana, female    DOB: 10/27/1940, 76 y.o.   MRN: [id number]  HPI  Patient here for a scheduled follow up.  Anita Santana is accompanied by her husband and daughter.  History obtained from all of them.  Anita Santana is followed by neurology for her dementia.  Continues to progress.  Namenda was recently added.  Decreased po intake.  Weight loss.  Hard for family to get her to eat or drink.  We discussed this at length.  Discussed some nutritional supplements.  No nausea or vomiting.  No chest pain.  No abdominal pain.  Bowels moving.  Reports some leg cramps at times.  No pain with ambulation.  Occurs mostly when lying down at night.  Some dizziness.  Feels a little light headed when stands.  Discussed slow position changes and movements.  Not constant.  Anita Santana denies headache.  They had discussed this with neurology.  Felt was progression of her underlying issues.     Past Medical History:  Diagnosis Date  . Angioedema   . Arthritis   . Dementia   . Diverticulosis of colon 02/08/12   Colonoscopy  . GERD (gastroesophageal reflux disease)   . Hypercholesteremia   . Hypertension   . Psoriasis   . Syncope and collapse   . TIA (transient ischemic attack)    Past Surgical History:  Procedure Laterality Date  . cataract surgery  2012  . COLONOSCOPY WITH PROPOFOL N/A 07/01/2015   Procedure: COLONOSCOPY WITH PROPOFOL;  Surgeon: Lollie Sails, MD;  Location: [facility] ENDOSCOPY;  Service: Endoscopy;  Laterality: N/A;  . COLONOSCOPY WITH PROPOFOL N/A 07/04/2015   Procedure: COLONOSCOPY WITH PROPOFOL;  Surgeon: Lollie Sails, MD;  Location: [facility] ENDOSCOPY;  Service: Endoscopy;  Laterality: N/A;  . EYE SURGERY    . TONSILECTOMY/ADENOIDECTOMY WITH MYRINGOTOMY  1950  . TONSILLECTOMY     Family History  Problem Relation Age of Onset  . Hypercholesterolemia Mother   . Stroke Mother   . Hypertension  Mother   . Cancer Mother        ovarian/uterus  . Hypercholesterolemia Father   . Heart disease Father   . Heart attack Father   . Cancer Maternal Grandmother        unsure of what type   Social History   Social History  . Marital status: Married    Spouse name: N/A  . Number of children: N/A  . Years of education: N/A   Social History Main Topics  . Smoking status: Never Smoker  . Smokeless tobacco: Never Used  . Alcohol use No  . Drug use: No  . Sexual activity: Not Currently   Other Topics Concern  . None   Social History Narrative  . None    Outpatient Encounter Prescriptions as of 08/17/2016  Medication Sig  . acetaminophen (TYLENOL ARTHRITIS PAIN) 650 MG CR tablet Take 650 mg by mouth as needed. Reported on 01/24/2015  . atorvastatin (LIPITOR) 20 MG tablet TAKE 1 TABLET EVERY DAY (NEED APPOINTMENT FOR ADDITIONAL REFILLS)  . galantamine (RAZADYNE) 8 MG tablet Reported on 07/01/2015  . lisinopril (PRINIVIL,ZESTRIL) 40 MG tablet TAKE 1 TABLET EVERY DAY (NEED APPOINTMENT FOR FURTHER REFILLS)  . memantine (NAMENDA) 5 MG tablet Take 5 mg by mouth 2 (two) times daily.  . pantoprazole (PROTONIX) 40 MG tablet TAKE 1 TABLET EVERY DAY  . [  DISCONTINUED] aspirin 81 MG tablet Take 81 mg by mouth daily.  . [DISCONTINUED] Calcium Carbonate-Vitamin D (CALCIUM 600+D) 600-200 MG-UNIT TABS Take 1 tablet by mouth daily.  . [DISCONTINUED] dicyclomine (BENTYL) 20 MG tablet Take 1 tablet (20 mg total) by mouth 3 (three) times daily as needed (abdominal pain).  . [DISCONTINUED] hydrochlorothiazide (HYDRODIURIL) 25 MG tablet TAKE 1/2 TABLET EVERY DAY   No facility-administered encounter medications on file as of 08/17/2016.     Review of Systems  Constitutional:       Decreased po intake.  Weight loss.    HENT: Negative for congestion and sinus pressure.   Respiratory: Negative for cough, chest tightness and shortness of breath.   Cardiovascular: Negative for chest pain, palpitations and  leg swelling.  Gastrointestinal: Negative for abdominal pain, diarrhea, nausea and vomiting.  Genitourinary: Negative for difficulty urinating and dysuria.  Musculoskeletal: Negative for back pain and joint swelling.       Leg cramps as outlined.    Skin: Negative for color change and rash.  Neurological: Positive for dizziness.       Pt denies headaches.    Psychiatric/Behavioral: Negative for agitation and dysphoric mood.       Objective:    Physical Exam  Constitutional: Anita Santana appears well-developed and well-nourished. No distress.  HENT:  Nose: Nose normal.  Mouth/Throat: Oropharynx is clear and moist.  Neck: Neck supple. No thyromegaly present.  Cardiovascular: Normal rate and regular rhythm.   Pulmonary/Chest: Breath sounds normal. No respiratory distress. Anita Santana has no wheezes.  Abdominal: Soft. Bowel sounds are normal. There is no tenderness.  Musculoskeletal: Anita Santana exhibits no edema or tenderness.  Lymphadenopathy:    Anita Santana has no cervical adenopathy.  Skin: No rash noted. No erythema.  Psychiatric: Anita Santana has a normal mood and affect. Her behavior is normal.    BP 138/78 (BP Location: Left Arm, Patient Position: Sitting, Cuff Size: Normal)   Pulse 60   Temp 98.4 F (36.9 C) (Oral)   Resp 12   Ht '5\' 6"'  (1.676 m)   Wt 124 lb 12.8 oz (56.6 kg)   SpO2 98%   BMI 20.14 kg/m  Wt Readings from Last 3 Encounters:  08/17/16 124 lb 12.8 oz (56.6 kg)  10/31/15 133 lb 9.6 oz (60.6 kg)  07/04/15 144 lb (65.3 kg)     Lab Results  Component Value Date   WBC 8.0 05/01/2015   HGB 12.4 05/01/2015   HCT 36.2 05/01/2015   PLT 287 05/01/2015   GLUCOSE 84 10/31/2015   CHOL 174 10/31/2015   TRIG 92.0 10/31/2015   HDL 68.90 10/31/2015   LDLDIRECT 237.0 07/15/2012   LDLCALC 86 10/31/2015   ALT 21 10/31/2015   AST 22 10/31/2015   NA 140 10/31/2015   K 3.8 10/31/2015   CL 102 10/31/2015   CREATININE 0.79 10/31/2015   BUN 21 10/31/2015   CO2 31 10/31/2015   TSH 1.19 10/31/2015    HGBA1C 5.5 10/31/2015    Mr Lumbar Spine Wo Contrast  Result Date: 02/19/2016 CLINICAL DATA:  76 year old female with 5 days of lumbar back pain and left flank pain. No known injury. Initial encounter. EXAM: MRI LUMBAR SPINE WITHOUT CONTRAST TECHNIQUE: Multiplanar, multisequence MR imaging of the lumbar spine was performed. No intravenous contrast was administered. COMPARISON:  Lumbar radiographs 09/30/2015. CT Abdomen and Pelvis 04/26/2015 FINDINGS: Segmentation: Transitional anatomy as seen on the comparison radiographs. The same numbering system will be used as on those images designating a fully lumbarized S1 level.  The lowest open disc spaces S1-S2. The lowest ribs are T12. Correlation with radiographs is recommended prior to any operative intervention. Alignment: Moderate levoconvex lumbar scoliosis. Straightening of lumbar lordosis. Stable vertebral height and alignment since 2017. Vertebrae: Degenerative endplate marrow signal changes throughout much of the lumbar spine. Low level marrow edema at the L4-L5 and L5-S1 endplates eccentric to the left (series 4, image 10). Elsewhere bone marrow signal is within normal limits. No other acute osseous abnormality identified. Negative visible sacral ala and SI joints. Conus medullaris: Extends to the L1-L2 level and appears normal. Paraspinal and other soft tissues: Negative. Disc levels: T11-T12: Disc desiccation disc space loss and broad-based disc bulge. Mild to moderate facet hypertrophy worse on the left. No significant spinal stenosis, but severe left T11 foraminal stenosis (series 2, image 7) which probably is not changed since 2017. T12-L1:  Negative. L1-L2: Disc space loss with right eccentric circumferential disc bulge. Broad-based posterior component. Mild facet hypertrophy. Borderline to mild spinal, right lateral recess, and right L1 foraminal stenosis. L2-L3: Severe disc space loss. Right eccentric circumferential disc osteophyte complex with  broad-based posterior component. Mild facet and ligament flavum hypertrophy. Borderline to mild spinal, right lateral recess, and right L2 foraminal stenosis. L3-L4: Severe disc space loss. Right eccentric circumferential disc osteophyte complex with broad-based posterior component. Mild right facet hypertrophy. Borderline to mild spinal and right lateral recess stenosis. L4-L5: Severe disc space loss. Vacuum disc here on the prior CT. Left eccentric circumferential disc osteophyte complex with broad-based posterior component. Mild facet and ligament flavum hypertrophy. Borderline to mild spinal stenosis. Mild left lateral recess and bilateral L4 foraminal stenosis. L5-S1: Severe disc space loss. Vacuum disc here on the prior CT. Left eccentric circumferential disc osteophyte complex with mildly lobulated posterior component. Mild to moderate facet hypertrophy greater on the left. Mild ligament flavum hypertrophy. No spinal stenosis. Borderline to mild left lateral recess stenosis. Mild left L5 foraminal stenosis. S1-S2: Fully lumbarized S1 level. Normal disc. Moderate bilateral facet hypertrophy. No stenosis. IMPRESSION: 1. 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 This is the same numbering system as on the 09/30/2015 radiographs. Correlation with radiographs is recommended prior to any operative intervention. 2. Chronic levoconvex lumbar scoliosis. Associated facet hypertrophy, and advanced chronic lumbar disc and endplate degeneration. Degenerative appearing mild marrow edema along the left vertebral endplates at J8-A4 and Z6-S0. 3. Up to mild multilevel lumbar spinal stenosis is multifactorial, with intermittent mild lateral recess and foraminal stenosis. There is moderate to severe left side lower thoracic neural foraminal stenosis at the T11 nerve level, but this probably has not significantly changed since the 2017 CT Abdomen and Pelvis. Electronically Signed   By: Genevie Ann M.D.   On:  02/19/2016 13:59       Assessment & Plan:   Problem List Items Addressed This Visit    Dizziness    Dizziness as outlined.  Intermittent.  Discussed importance of staying hydrated.  Blood pressure looks good.  States they discussed with neurology.  Discussed further w/up.  Desires no further w/up at this time.  Follow.  Slow position changes and movements.        GERD (gastroesophageal reflux disease)    On protonix. No acid reflux reported.       Hypercholesteremia    On lipitor.  Follow lipid panel and liver function tests.        Relevant Orders   Lipid panel   Hyperglycemia    Recheck met b and a1c.  Relevant Orders   Hemoglobin A1c   Hypertension    Blood pressure under good control.  Continue same medication regimen.  Follow pressures.  Follow metabolic panel.        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  Namenda recently added.  Decreased po intake.  Discussed the need to continue to encourage increased po intake.  Discussed nutritional supplements,etc.        Relevant Medications   memantine (NAMENDA) 5 MG tablet    Other Visit Diagnoses    Weight loss       Relevant Orders   CBC with Differential/Platelet   TSH   Breast cancer screening       Relevant Orders   MM DIGITAL SCREENING BILATERAL     I spent 40 minutes with the patient and more than 50% of the time was spent in consultation regarding the above.  Time spent obtaining history and discussing current concerns and symptoms. Time also spent discussing plans for further w/up and evaluation.     Einar Pheasant, MD

## 2016-08-17 NOTE — Progress Notes (Signed)
Pre-visit discussion using our clinic review tool. No additional management support is needed unless otherwise documented below in the visit note.  

## 2016-08-18 ENCOUNTER — Encounter: Payer: Self-pay | Admitting: Internal Medicine

## 2016-08-18 DIAGNOSIS — R42 Dizziness and giddiness: Secondary | ICD-10-CM | POA: Insufficient documentation

## 2016-08-18 LAB — COMPREHENSIVE METABOLIC PANEL
ALK PHOS: 63 U/L (ref 39–117)
ALT: 20 U/L (ref 0–35)
AST: 21 U/L (ref 0–37)
Albumin: 4.4 g/dL (ref 3.5–5.2)
BUN: 16 mg/dL (ref 6–23)
CHLORIDE: 102 meq/L (ref 96–112)
CO2: 29 mEq/L (ref 19–32)
CREATININE: 0.78 mg/dL (ref 0.40–1.20)
Calcium: 9.4 mg/dL (ref 8.4–10.5)
GFR: 76.36 mL/min (ref >60.00)
GLUCOSE: 100 mg/dL — AB (ref 70–99)
POTASSIUM: 4 meq/L (ref 3.5–5.1)
SODIUM: 139 meq/L (ref 135–145)
TOTAL PROTEIN: 6.5 g/dL (ref 6.0–8.3)
Total Bilirubin: 0.8 mg/dL (ref 0.2–1.2)

## 2016-08-18 LAB — CBC WITH DIFFERENTIAL/PLATELET
BASOS PCT: 1.1 % (ref 0.0–3.0)
Basophils Absolute: 0.1 10*3/uL (ref 0.0–0.1)
EOS ABS: 0.2 10*3/uL (ref 0.0–0.7)
Eosinophils Relative: 2.3 % (ref 0.0–5.0)
HCT: 43.6 % (ref 36.0–46.0)
HEMOGLOBIN: 14.6 g/dL (ref 12.0–15.0)
LYMPHS ABS: 2.4 10*3/uL (ref 0.7–4.0)
LYMPHS PCT: 26.9 % (ref 12.0–46.0)
MCHC: 33.4 g/dL (ref 30.0–36.0)
MCV: 92.7 fl (ref 78.0–100.0)
MONO ABS: 0.5 10*3/uL (ref 0.1–1.0)
Monocytes Relative: 5.5 % (ref 3.0–12.0)
NEUTROS PCT: 64.2 % (ref 43.0–77.0)
Neutro Abs: 5.7 10*3/uL (ref 1.4–7.7)
Platelets: 259 10*3/uL (ref 150.0–400.0)
RBC: 4.71 Mil/uL (ref 3.87–5.11)
RDW: 14.5 % (ref 11.5–15.5)
WBC: 8.8 10*3/uL (ref 4.0–10.5)

## 2016-08-18 LAB — LIPID PANEL
CHOL/HDL RATIO: 2
Cholesterol: 173 mg/dL (ref 0–200)
HDL: 73 mg/dL (ref >39.00)
LDL Cholesterol: 84 mg/dL (ref 0–99)
NonHDL: 99.87
Triglycerides: 81 mg/dL (ref 0.0–149.0)
VLDL: 16.2 mg/dL (ref 0.0–40.0)

## 2016-08-18 LAB — TSH: TSH: 2.14 u[IU]/mL (ref 0.35–4.50)

## 2016-08-18 LAB — HEMOGLOBIN A1C: HEMOGLOBIN A1C: 5.6 % (ref 4.6–6.5)

## 2016-08-18 NOTE — Assessment & Plan Note (Signed)
Dizziness as outlined.  Intermittent.  Discussed importance of staying hydrated.  Blood pressure looks good.  States they discussed with neurology.  Discussed further w/up.  Desires no further w/up at this time.  Follow.  Slow position changes and movements.

## 2016-08-18 NOTE — Assessment & Plan Note (Signed)
On protonix.  No acid reflux reported.  

## 2016-08-18 NOTE — Assessment & Plan Note (Signed)
Followed by Dr Sherryll BurgerShah.  Namenda recently added.  Decreased po intake.  Discussed the need to continue to encourage increased po intake.  Discussed nutritional supplements,etc.

## 2016-08-18 NOTE — Assessment & Plan Note (Signed)
Recheck met b and a1c.  

## 2016-08-18 NOTE — Assessment & Plan Note (Signed)
Blood pressure under good control.  Continue same medication regimen.  Follow pressures.  Follow metabolic panel.   

## 2016-08-18 NOTE — Assessment & Plan Note (Signed)
On lipitor.  Follow lipid panel and liver function tests.   

## 2016-08-19 NOTE — Telephone Encounter (Signed)
Called husband asked for wife lab results I have given information to him as well as mailing copy with his lab results.

## 2016-08-25 NOTE — Telephone Encounter (Signed)
Scheduled 11/30/16 °

## 2016-09-28 ENCOUNTER — Ambulatory Visit: Payer: Commercial Managed Care - HMO

## 2016-10-07 ENCOUNTER — Ambulatory Visit (INDEPENDENT_AMBULATORY_CARE_PROVIDER_SITE_OTHER): Payer: Commercial Managed Care - HMO | Admitting: *Deleted

## 2016-10-07 DIAGNOSIS — Z23 Encounter for immunization: Secondary | ICD-10-CM | POA: Diagnosis not present

## 2016-10-07 NOTE — Progress Notes (Signed)
[id number]

## 2016-10-09 ENCOUNTER — Encounter: Payer: Self-pay | Admitting: Internal Medicine

## 2016-10-09 ENCOUNTER — Emergency Department
Admission: EM | Admit: 2016-10-09 | Discharge: 2016-10-09 | Disposition: A | Discharge status: Home / Self Care | Payer: Medicare HMO | Attending: Emergency Medicine | Admitting: Emergency Medicine

## 2016-10-09 ENCOUNTER — Encounter: Payer: Self-pay | Admitting: *Deleted

## 2016-10-09 DIAGNOSIS — F0150 Vascular dementia without behavioral disturbance: Secondary | ICD-10-CM | POA: Insufficient documentation

## 2016-10-09 DIAGNOSIS — Z8673 Personal history of transient ischemic attack (TIA), and cerebral infarction without residual deficits: Secondary | ICD-10-CM | POA: Insufficient documentation

## 2016-10-09 DIAGNOSIS — Z79899 Other long term (current) drug therapy: Secondary | ICD-10-CM | POA: Insufficient documentation

## 2016-10-09 DIAGNOSIS — G309 Alzheimer's disease, unspecified: Secondary | ICD-10-CM | POA: Insufficient documentation

## 2016-10-09 DIAGNOSIS — E860 Dehydration: Secondary | ICD-10-CM | POA: Insufficient documentation

## 2016-10-09 DIAGNOSIS — I10 Essential (primary) hypertension: Secondary | ICD-10-CM | POA: Insufficient documentation

## 2016-10-09 DIAGNOSIS — R531 Weakness: Secondary | ICD-10-CM

## 2016-10-09 DIAGNOSIS — R350 Frequency of micturition: Secondary | ICD-10-CM | POA: Diagnosis not present

## 2016-10-09 DIAGNOSIS — F0280 Dementia in other diseases classified elsewhere without behavioral disturbance: Principal | ICD-10-CM

## 2016-10-09 LAB — URINALYSIS, COMPLETE (UACMP) WITH MICROSCOPIC
Bacteria, UA: NONE SEEN
Bilirubin Urine: NEGATIVE
GLUCOSE, UA: NEGATIVE mg/dL
HGB URINE DIPSTICK: NEGATIVE
Ketones, ur: NEGATIVE mg/dL
Leukocytes, UA: NEGATIVE
NITRITE: NEGATIVE
PH: 6 (ref 5.0–8.0)
PROTEIN: NEGATIVE mg/dL
SPECIFIC GRAVITY, URINE: 1.008 (ref 1.005–1.030)

## 2016-10-09 LAB — COMPREHENSIVE METABOLIC PANEL
ALK PHOS: 61 U/L (ref 38–126)
ALT: 21 U/L (ref 14–54)
ANION GAP: 10 (ref 5–15)
AST: 22 U/L (ref 15–41)
Albumin: 4.1 g/dL (ref 3.5–5.0)
BILIRUBIN TOTAL: 0.8 mg/dL (ref 0.3–1.2)
BUN: 9 mg/dL (ref 6–20)
CALCIUM: 8.9 mg/dL (ref 8.9–10.3)
CO2: 27 mmol/L (ref 22–32)
Chloride: 102 mmol/L (ref 101–111)
Creatinine, Ser: 0.7 mg/dL (ref 0.44–1.00)
GFR calc non Af Amer: >60 mL/min (ref >60)
GLUCOSE: 104 mg/dL — AB (ref 65–99)
Potassium: 3.2 mmol/L — ABNORMAL LOW (ref 3.5–5.1)
Sodium: 139 mmol/L (ref 135–145)
TOTAL PROTEIN: 6.9 g/dL (ref 6.5–8.1)

## 2016-10-09 LAB — CBC
HCT: 37.8 % (ref 35.0–47.0)
HEMOGLOBIN: 13 g/dL (ref 12.0–16.0)
MCH: 31 pg (ref 26.0–34.0)
MCHC: 34.4 g/dL (ref 32.0–36.0)
MCV: 90.1 fL (ref 80.0–100.0)
Platelets: 234 10*3/uL (ref 150–440)
RBC: 4.2 MIL/uL (ref 3.80–5.20)
RDW: 14.2 % (ref 11.5–14.5)
WBC: 9.2 10*3/uL (ref 3.6–11.0)

## 2016-10-09 MED ORDER — SODIUM CHLORIDE 0.9 % IV SOLN
1000.0000 mL | Freq: Once | INTRAVENOUS | Status: AC
  Administered 2016-10-09: 1000 mL via INTRAVENOUS

## 2016-10-09 NOTE — Telephone Encounter (Signed)
Agree with evaluation today.  

## 2016-10-09 NOTE — Telephone Encounter (Signed)
I do not mind setting this up, but she is in the ER now.  Need to see what they do as far as what f/u warranted.

## 2016-10-09 NOTE — ED Notes (Signed)
Informed pt we need a urine sample and stool sample.  Containers placed in the toilet.

## 2016-10-09 NOTE — ED Notes (Signed)
Pt c/o diarrhea for 4-5 days, husband at bedside states it is worse at night. Pt is eating and drinking.  Pt ate breakfast, no diarrhea since awakening.  Pt denies any abdominal pain, N/V.

## 2016-10-09 NOTE — ED Notes (Addendum)
Assisted pt to bathroom at this time to obtain urine sample.  Pt unable to go at this time.

## 2016-10-09 NOTE — Telephone Encounter (Signed)
Talked with patient husband patient has had persistent diarrhea since Sunday and  Patient husband has been giving her Gatorade about 70 to  80 OZs per day. Patient is having shaking at nighty and is very weak per husband. Verbally advised PCP of patient condition and PCP agreed patient should be evaluated at the ED . Called patient Husband (DPR) and he agreed he would taker her to the ED to be evaluated. FYI

## 2016-10-09 NOTE — ED Triage Notes (Signed)
First Nurse Note:  Sent to ED by Dr. Lorin Picket for ED evaluation of dehydration due to diarrhea.

## 2016-10-09 NOTE — ED Triage Notes (Signed)
Pt sent by MD to rule out dehydration, pt has dementia, husband reports pt has been drinking less fluids with frequent urination, pt denies pain or any other symptoms

## 2016-10-09 NOTE — Telephone Encounter (Signed)
Pt called back returning call. Please advise, thank you!  Call [phone]

## 2016-10-09 NOTE — ED Provider Notes (Signed)
[facility] Emergency Department Provider Note   ____________________________________________    I have reviewed the triage vital signs and the nursing notes.   HISTORY  Chief Complaint Dehydration  Patient has a history of dementia, history is primarily by husband   HPI Anita Santana is a 76 y.o. female Presents with complaints of fatigue and frequent urination. Husband reports that she has been getting up a lot in the middle of the night to urinate and has felt somewhat weak. They called her doctor's office today and the nurse who answered told them to come to the emergency department.husband reports she has been giving her Gatorade and an attempt to keep her hydrated. Patient has no physical complaints, she states she feels "okay". No fevers or chills. No cough. No dysuria reported.  Past Medical History:  Diagnosis Date  . Angioedema   . Arthritis   . Dementia   . Diverticulosis of colon 02/08/12   Colonoscopy  . GERD (gastroesophageal reflux disease)   . Hypercholesteremia   . Hypertension   . Psoriasis   . Syncope and collapse   . TIA (transient ischemic attack)     Patient Active Problem List   Diagnosis Date Noted  . Dizziness 08/18/2016  . Back pain 11/02/2015  . LLQ abdominal pain 04/26/2015  . Health care maintenance 02/06/2014  . Hyperglycemia 02/06/2014  . Stress 08/04/2012  . Abnormal mammogram 07/17/2012  . 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 Diverticulosis of colon without hemorrhage 02/15/2012  . Dysrhythmia, cardiac 02/09/2012  . Hypertension 11/06/2011  . GERD (gastroesophageal reflux disease) 11/06/2011  . TIA (transient ischemic attack) 11/06/2011  . Hypercholesteremia 11/05/2011    Past Surgical History:  Procedure Laterality Date  . cataract surgery  2012  . COLONOSCOPY WITH PROPOFOL N/A 07/01/2015   Procedure: COLONOSCOPY WITH PROPOFOL;  Surgeon: Christena Deem, MD;  Location: [facility]  ENDOSCOPY;  Service: Endoscopy;  Laterality: N/A;  . COLONOSCOPY WITH PROPOFOL N/A 07/04/2015   Procedure: COLONOSCOPY WITH PROPOFOL;  Surgeon: Christena Deem, MD;  Location: [facility] ENDOSCOPY;  Service: Endoscopy;  Laterality: N/A;  . EYE SURGERY    . TONSILECTOMY/ADENOIDECTOMY WITH MYRINGOTOMY  1950  . TONSILLECTOMY      Prior to Admission medications   Medication Sig Start Date End Date Taking? Authorizing Provider  acetaminophen (TYLENOL ARTHRITIS PAIN) 650 MG CR tablet Take 650 mg by mouth as needed. Reported on 01/24/2015   Yes [provider]  atorvastatin (LIPITOR) 20 MG tablet TAKE 1 TABLET EVERY DAY (NEED APPOINTMENT FOR ADDITIONAL REFILLS) 08/12/16  Yes Dale Costilla, MD  galantamine (RAZADYNE) 12 MG tablet Take 12 mg by mouth 2 (two) times daily.   Yes [provider]  ibuprofen (ADVIL,MOTRIN) 200 MG tablet Take 400 mg by mouth 2 (two) times daily as needed.   Yes [provider]  lisinopril (PRINIVIL,ZESTRIL) 40 MG tablet TAKE 1 TABLET EVERY DAY (NEED APPOINTMENT FOR FURTHER REFILLS) 08/12/16  Yes Dale [place], MD  pantoprazole (PROTONIX) 40 MG tablet TAKE 1 TABLET EVERY DAY 08/12/16  Yes Dale [place], MD     Allergies Plavix [clopidogrel bisulfate] and Penicillins  Family History  Problem Relation Age of Onset  . Hypercholesterolemia Mother   . Stroke Mother   . Hypertension Mother   . Cancer Mother        ovarian/uterus  . Hypercholesterolemia Father   . Heart disease Father   . Heart attack Father   . Cancer Maternal Grandmother  unsure of what type    Social History Social History  Substance Use Topics  . Smoking status: Never Smoker  . Smokeless tobacco: Never Used  . Alcohol use No    Review of Systems  Constitutional: No fever/chills Eyes: No visual changes.  ENT: No neck pain Cardiovascular: Denies chest pain. Respiratory: Denies shortness of breath. Gastrointestinal: No abdominal pain.  No nausea, no vomiting.     Genitourinary: Negative for dysuria.frequency Musculoskeletal: Negative for back pain. Skin: Negative for rash. Neurological: Negative for headaches   ____________________________________________   PHYSICAL EXAM:  VITAL SIGNS: ED Triage Vitals  Enc Vitals Group     BP 10/09/16 1011 (!) 143/60     Pulse Rate 10/09/16 1011 63     Resp 10/09/16 1011 18     Temp 10/09/16 1011 98.2 F (36.8 C)     Temp Source 10/09/16 1011 Oral     SpO2 10/09/16 1011 100 %     Weight 10/09/16 1012 56.7 kg (125 lb)     Height 10/09/16 1012 1.676 m ( )     Head Circumference --      Peak Flow --      Pain Score 10/09/16 1214 0     Pain Loc --      Pain Edu? --      Excl. in GC? --     Constitutional: Alert. No acute distress. Pleasant and interactive Eyes: Conjunctivae are normal.   Nose: No congestion/rhinnorhea. Mouth/Throat: Mucous membranes are moist.    Cardiovascular: Normal rate, regular rhythm. Grossly normal heart sounds.  Good peripheral circulation. Respiratory: Normal respiratory effort.  No retractions. Lungs CTAB. Gastrointestinal: Soft and nontender. No distention.  No CVA tenderness. Genitourinary: deferred Musculoskeletal:   Warm and well perfused Neurologic:  Normal speech and language. No gross focal neurologic deficits are appreciated.  Skin:  Skin is warm, dry and intact. No rash noted. Psychiatric: Mood and affect are normal. Speech and behavior are normal.  ____________________________________________   LABS (all labs ordered are listed, but only abnormal results are displayed)  Labs Reviewed  COMPREHENSIVE METABOLIC PANEL - Abnormal; Notable for the following:       Result Value   Potassium 3.2 (*)    Glucose, Bld 104 (*)    All other components within normal limits  URINALYSIS, COMPLETE (UACMP) WITH MICROSCOPIC - Abnormal; Notable for the following:    Color, Urine YELLOW (*)    APPearance CLEAR (*)    Squamous Epithelial / LPF 0-5 (*)    All other  components within normal limits  CBC   ____________________________________________  EKG  ED ECG REPORT I, Jene Every, the attending physician, personally viewed and interpreted this ECG.  Date: 10/09/2016   Rhythm: normal sinus rhythm QRS Axis: normal Intervals: normal ST/T Wave abnormalities: normal Narrative Interpretation: no evidence of acute ischemia  ____________________________________________  RADIOLOGY  none   ____________________________________________   PROCEDURES  Procedure(s) performed: No    Critical Care performed:No ____________________________________________   INITIAL IMPRESSION / ASSESSMENT AND PLAN / ED COURSE  Pertinent labs & imaging results that were available during my care of the patient were reviewed by me and considered in my medical decision making (see chart for details).  Patient presents with complaints of mild fatigue and possible dehydration. Medical records reviewed and noncontributory to today's complaint.  Differential diagnosis includes dehydration, urinary tract infection, electrolyte abnormalities, viral illness, etc..  We will check labs given IV fluids and reevaluate   Patient felt significant  better after IV fluids. Lab work is unremarkable. Vital signs are stable. She states she is anxious to leave and her husband is reassured. Appropriate for discharge at this time    ____________________________________________   FINAL CLINICAL IMPRESSION(S) / ED DIAGNOSES  Final diagnoses:  Dehydration      NEW MEDICATIONS STARTED DURING THIS VISIT:  Discharge Medication List as of 10/09/2016  2:27 PM       Note:  This document was prepared using Dragon voice recognition software and may include unintentional dictation errors.    Jene Every, MD 10/09/16 [phone]

## 2016-10-12 NOTE — Telephone Encounter (Signed)
I have placed the order for the referral to home health.  Someone should be contacting them.  Regarding her ER evaluation, they were concerned about possible dehydration.  She was given fluids.  Documented pt felt better after fluids.  Labs did not reveal significant dehydration.

## 2016-10-13 NOTE — Telephone Encounter (Signed)
Spoke with patient daughter (POA0 concerning ED findings and the referral to home health, was advised by POA that patient is going to FU with neurology due to patient is still having periods of shaking. Patient is drinking and eating well, no diarrhea or nausea.

## 2016-10-14 NOTE — Telephone Encounter (Signed)
[facility] lvm stating that patients daughter and husband is refusing home health. They thought it was just going to be to make sure that she is eating, drinking and to make sure that she is bathed. Please cb explain to family [phone]

## 2016-10-19 NOTE — Telephone Encounter (Signed)
See Melissa's message.  pts husband and daughter refusing home health.  She sent message asking to call to explain.  Do you mind calling.    Thanks  Let me know if I need to do anything.

## 2016-10-20 DIAGNOSIS — G309 Alzheimer's disease, unspecified: Secondary | ICD-10-CM | POA: Diagnosis not present

## 2016-10-20 DIAGNOSIS — F0280 Dementia in other diseases classified elsewhere without behavioral disturbance: Secondary | ICD-10-CM | POA: Diagnosis not present

## 2016-10-20 DIAGNOSIS — F0150 Vascular dementia without behavioral disturbance: Secondary | ICD-10-CM | POA: Diagnosis not present

## 2016-10-20 NOTE — Telephone Encounter (Signed)
Left message for patient to return call t office.

## 2016-10-21 NOTE — Telephone Encounter (Signed)
Pt daughter called back returning your call. Thank you!

## 2016-10-23 NOTE — Telephone Encounter (Signed)
Please see message.  Can we contact home health and see if they can come out for nursing.  Following intake and output, etc.  See note.

## 2016-10-23 NOTE — Telephone Encounter (Signed)
I spoke with Daughter/POA.  She and her father refused home health as when they called they stated it was for Physical therapy.  Daughter was under the impression that it was for nursing care: I.e assessing vitals and intake and output.  They are not interested in PT care.  Please advise.  It seems like a lot of miscommunication. thanks

## 2016-10-26 NOTE — Telephone Encounter (Signed)
I have spoke [place] [facility] regarding this patient. She said that they can come out and monitor as in label her fluids and intake but they can not stay with her to make sure that she is eating/drinking like she is suppose to.

## 2016-10-27 NOTE — Telephone Encounter (Signed)
Olegario MessierKathy, please see me about this pt.  I did not know if [facility] would be a good option for them.  I can explain what they need.  When you get a chance, see me.  Thanks

## 2016-10-29 DIAGNOSIS — F0390 Unspecified dementia without behavioral disturbance: Secondary | ICD-10-CM | POA: Diagnosis not present

## 2016-10-29 DIAGNOSIS — I10 Essential (primary) hypertension: Secondary | ICD-10-CM | POA: Diagnosis not present

## 2016-10-29 DIAGNOSIS — E785 Hyperlipidemia, unspecified: Secondary | ICD-10-CM | POA: Diagnosis not present

## 2016-10-29 DIAGNOSIS — M1990 Unspecified osteoarthritis, unspecified site: Secondary | ICD-10-CM | POA: Diagnosis not present

## 2016-11-04 DIAGNOSIS — E785 Hyperlipidemia, unspecified: Secondary | ICD-10-CM | POA: Diagnosis not present

## 2016-11-04 DIAGNOSIS — M1990 Unspecified osteoarthritis, unspecified site: Secondary | ICD-10-CM | POA: Diagnosis not present

## 2016-11-04 DIAGNOSIS — F0390 Unspecified dementia without behavioral disturbance: Secondary | ICD-10-CM | POA: Diagnosis not present

## 2016-11-04 DIAGNOSIS — I10 Essential (primary) hypertension: Secondary | ICD-10-CM | POA: Diagnosis not present

## 2016-11-06 DIAGNOSIS — M5136 Other intervertebral disc degeneration, lumbar region: Secondary | ICD-10-CM | POA: Diagnosis not present

## 2016-11-06 DIAGNOSIS — M7551 Bursitis of right shoulder: Secondary | ICD-10-CM | POA: Diagnosis not present

## 2016-11-06 DIAGNOSIS — I10 Essential (primary) hypertension: Secondary | ICD-10-CM | POA: Diagnosis not present

## 2016-11-06 DIAGNOSIS — M62830 Muscle spasm of back: Secondary | ICD-10-CM | POA: Diagnosis not present

## 2016-11-06 DIAGNOSIS — G309 Alzheimer's disease, unspecified: Secondary | ICD-10-CM | POA: Diagnosis not present

## 2016-11-06 DIAGNOSIS — M7552 Bursitis of left shoulder: Secondary | ICD-10-CM | POA: Diagnosis not present

## 2016-11-06 DIAGNOSIS — F0280 Dementia in other diseases classified elsewhere without behavioral disturbance: Secondary | ICD-10-CM | POA: Diagnosis not present

## 2016-11-06 DIAGNOSIS — M5416 Radiculopathy, lumbar region: Secondary | ICD-10-CM | POA: Diagnosis not present

## 2016-11-11 DIAGNOSIS — I10 Essential (primary) hypertension: Secondary | ICD-10-CM | POA: Diagnosis not present

## 2016-11-11 DIAGNOSIS — F0280 Dementia in other diseases classified elsewhere without behavioral disturbance: Secondary | ICD-10-CM | POA: Diagnosis not present

## 2016-11-11 DIAGNOSIS — G309 Alzheimer's disease, unspecified: Secondary | ICD-10-CM | POA: Diagnosis not present

## 2016-11-13 DIAGNOSIS — F0280 Dementia in other diseases classified elsewhere without behavioral disturbance: Secondary | ICD-10-CM | POA: Diagnosis not present

## 2016-11-13 DIAGNOSIS — I10 Essential (primary) hypertension: Secondary | ICD-10-CM | POA: Diagnosis not present

## 2016-11-13 DIAGNOSIS — G309 Alzheimer's disease, unspecified: Secondary | ICD-10-CM | POA: Diagnosis not present

## 2016-11-18 ENCOUNTER — Emergency Department
Admission: EM | Admit: 2016-11-18 | Discharge: 2016-11-18 | Disposition: A | Discharge status: Home / Self Care | Payer: Medicare HMO | Attending: Student in an Organized Health Care Education/Training Program | Admitting: Student in an Organized Health Care Education/Training Program

## 2016-11-18 ENCOUNTER — Emergency Department: Payer: Medicare HMO

## 2016-11-18 ENCOUNTER — Other Ambulatory Visit: Payer: Self-pay

## 2016-11-18 DIAGNOSIS — I10 Essential (primary) hypertension: Secondary | ICD-10-CM | POA: Diagnosis not present

## 2016-11-18 DIAGNOSIS — R55 Syncope and collapse: Secondary | ICD-10-CM | POA: Diagnosis not present

## 2016-11-18 DIAGNOSIS — G309 Alzheimer's disease, unspecified: Secondary | ICD-10-CM | POA: Diagnosis not present

## 2016-11-18 DIAGNOSIS — E860 Dehydration: Secondary | ICD-10-CM | POA: Insufficient documentation

## 2016-11-18 DIAGNOSIS — F0390 Unspecified dementia without behavioral disturbance: Secondary | ICD-10-CM | POA: Diagnosis not present

## 2016-11-18 DIAGNOSIS — I951 Orthostatic hypotension: Secondary | ICD-10-CM

## 2016-11-18 DIAGNOSIS — Z79899 Other long term (current) drug therapy: Secondary | ICD-10-CM | POA: Diagnosis not present

## 2016-11-18 DIAGNOSIS — I959 Hypotension, unspecified: Secondary | ICD-10-CM | POA: Diagnosis not present

## 2016-11-18 DIAGNOSIS — Z8673 Personal history of transient ischemic attack (TIA), and cerebral infarction without residual deficits: Secondary | ICD-10-CM | POA: Insufficient documentation

## 2016-11-18 DIAGNOSIS — F0280 Dementia in other diseases classified elsewhere without behavioral disturbance: Secondary | ICD-10-CM | POA: Diagnosis not present

## 2016-11-18 LAB — URINALYSIS, COMPLETE (UACMP) WITH MICROSCOPIC
BACTERIA UA: NONE SEEN
BILIRUBIN URINE: NEGATIVE
GLUCOSE, UA: NEGATIVE mg/dL
HGB URINE DIPSTICK: NEGATIVE
Ketones, ur: 5 mg/dL — AB
LEUKOCYTES UA: NEGATIVE
NITRITE: NEGATIVE
Protein, ur: NEGATIVE mg/dL
SPECIFIC GRAVITY, URINE: 1.016 (ref 1.005–1.030)
Squamous Epithelial / LPF: NONE SEEN
pH: 6 (ref 5.0–8.0)

## 2016-11-18 LAB — CBC WITH DIFFERENTIAL/PLATELET
BASOS PCT: 1 %
Basophils Absolute: 0.1 10*3/uL (ref 0–0.1)
EOS ABS: 0.1 10*3/uL (ref 0–0.7)
EOS PCT: 1 %
HCT: 42 % (ref 35.0–47.0)
Hemoglobin: 13.8 g/dL (ref 12.0–16.0)
Lymphocytes Relative: 21 %
Lymphs Abs: 2.4 10*3/uL (ref 1.0–3.6)
MCH: 30.3 pg (ref 26.0–34.0)
MCHC: 32.8 g/dL (ref 32.0–36.0)
MCV: 92.4 fL (ref 80.0–100.0)
MONO ABS: 0.7 10*3/uL (ref 0.2–0.9)
MONOS PCT: 6 %
Neutro Abs: 8.3 10*3/uL — ABNORMAL HIGH (ref 1.4–6.5)
Neutrophils Relative %: 71 %
Platelets: 307 10*3/uL (ref 150–440)
RBC: 4.55 MIL/uL (ref 3.80–5.20)
RDW: 13.7 % (ref 11.5–14.5)
WBC: 11.5 10*3/uL — ABNORMAL HIGH (ref 3.6–11.0)

## 2016-11-18 LAB — COMPREHENSIVE METABOLIC PANEL
ALBUMIN: 3.7 g/dL (ref 3.5–5.0)
ALT: 37 U/L (ref 14–54)
ANION GAP: 12 (ref 5–15)
AST: 29 U/L (ref 15–41)
Alkaline Phosphatase: 62 U/L (ref 38–126)
BUN: 24 mg/dL — ABNORMAL HIGH (ref 6–20)
CO2: 24 mmol/L (ref 22–32)
Calcium: 8.9 mg/dL (ref 8.9–10.3)
Chloride: 100 mmol/L — ABNORMAL LOW (ref 101–111)
Creatinine, Ser: 0.81 mg/dL (ref 0.44–1.00)
GFR calc Af Amer: >60 mL/min (ref >60)
GFR calc non Af Amer: >60 mL/min (ref >60)
GLUCOSE: 118 mg/dL — AB (ref 65–99)
POTASSIUM: 4.1 mmol/L (ref 3.5–5.1)
SODIUM: 136 mmol/L (ref 135–145)
TOTAL PROTEIN: 6.6 g/dL (ref 6.5–8.1)
Total Bilirubin: 0.6 mg/dL (ref 0.3–1.2)

## 2016-11-18 LAB — TROPONIN I: Troponin I: <0.03 ng/mL (ref <0.03)

## 2016-11-18 MED ORDER — SODIUM CHLORIDE 0.9 % IV BOLUS (SEPSIS)
1000.0000 mL | Freq: Once | INTRAVENOUS | Status: AC
  Administered 2016-11-18: 1000 mL via INTRAVENOUS

## 2016-11-18 NOTE — ED Notes (Signed)
Pt in and out cath for urine sample - Iris RN assisted

## 2016-11-18 NOTE — ED Notes (Signed)
Pt and pt spouse are aware that urine sample is needed

## 2016-11-18 NOTE — ED Notes (Signed)
Pt reminded that urine sample is needed. 

## 2016-11-18 NOTE — ED Triage Notes (Signed)
Pt arrived via ems from home - home health reported that pt was sitting at table and had a syncopal episode that lasted approx 2-3 minutes and then returned to baseline (pt has dementia) - pt denies any pain, dizziness, chest pain, shortness of breath - pt reported at time of incident nausea but denies vomiting - nausea has resolved at this time

## 2016-11-18 NOTE — ED Notes (Signed)
Pt given ginger ale and crackers with peanut butter. 

## 2016-11-18 NOTE — ED Provider Notes (Signed)
[facility] Emergency Department Provider Note    First MD Initiated Contact with Patient 11/18/16 1207     (approximate)  I have reviewed the triage vital signs and the nursing notes.   HISTORY  Chief Complaint Loss of Consciousness  Level v Caveat: dementia  HPI Anita Santana is a 76 y.o. female presents after near syncopal episode that occurred shortly prior to arrival of the patient was sitting at the lunch table.  Patient states that she nodded off but did not fully lose consciousness.  Denies any chest pain or shortness of breath.  No nausea or vomiting.  According to EMS the patient has not had good intake over the past 3 days.  Looking through her medical patient has had previous visits to the ER secondary to dehydration.  Patient does have a history of dementia.  There is no report of trauma.  She denies any complaints at this time.  Past Medical History:  Diagnosis Date  . Angioedema   . Arthritis   . Dementia   . Diverticulosis of colon 02/08/12   Colonoscopy  . GERD (gastroesophageal reflux disease)   . Hypercholesteremia   . Hypertension   . Psoriasis   . Syncope and collapse   . TIA (transient ischemic attack)    Family History  Problem Relation Age of Onset  . Hypercholesterolemia Mother   . Stroke Mother   . Hypertension Mother   . Cancer Mother        ovarian/uterus  . Hypercholesterolemia Father   . Heart disease Father   . Heart attack Father   . Cancer Maternal Grandmother        unsure of what type   Past Surgical History:  Procedure Laterality Date  . cataract surgery  2012  . EYE SURGERY    . TONSILECTOMY/ADENOIDECTOMY WITH MYRINGOTOMY  1950  . TONSILLECTOMY     Patient Active Problem List   Diagnosis Date Noted  . Dizziness 08/18/2016  . Back pain 11/02/2015  . LLQ abdominal pain 04/26/2015  . Health care maintenance 02/06/2014  . Hyperglycemia 02/06/2014  . Stress 08/04/2012  . Abnormal mammogram 07/17/2012   . 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 Diverticulosis of colon without hemorrhage 02/15/2012  . Dysrhythmia, cardiac 02/09/2012  . Hypertension 11/06/2011  . GERD (gastroesophageal reflux disease) 11/06/2011  . TIA (transient ischemic attack) 11/06/2011  . Hypercholesteremia 11/05/2011      Prior to Admission medications   Medication Sig Start Date End Date Taking? Authorizing Provider  atorvastatin (LIPITOR) 20 MG tablet TAKE 1 TABLET EVERY DAY (NEED APPOINTMENT FOR ADDITIONAL REFILLS) 08/12/16  Yes Dale DurhamScott, Charlene, MD  baclofen (LIORESAL) 10 MG tablet Take 5 mg 2 (two) times daily by mouth.   Yes [provider]  galantamine (RAZADYNE) 12 MG tablet Take 12 mg by mouth 2 (two) times daily.   Yes [provider]  lisinopril (PRINIVIL,ZESTRIL) 40 MG tablet TAKE 1 TABLET EVERY DAY (NEED APPOINTMENT FOR FURTHER REFILLS) 08/12/16  Yes Dale DurhamScott, Charlene, MD  memantine (NAMENDA) 5 MG tablet Take 1 tablet 2 (two) times daily by mouth. 08/13/16  Yes [provider]  pantoprazole (PROTONIX) 40 MG tablet TAKE 1 TABLET EVERY DAY 08/12/16  Yes Dale DurhamScott, Charlene, MD  acetaminophen (TYLENOL ARTHRITIS PAIN) 650 MG CR tablet Take 650 mg by mouth as needed. Reported on 01/24/2015    [provider]  ibuprofen (ADVIL,MOTRIN) 200 MG tablet Take 400 mg by mouth 2 (two) times daily as  needed.    [provider]    Allergies Plavix [clopidogrel bisulfate] and Penicillins    Social History Social History   Tobacco Use  . Smoking status: Never Smoker  . Smokeless tobacco: Never Used  Substance Use Topics  . Alcohol use: No    Alcohol/week: 0.0 oz  . Drug use: No    Review of Systems Patient denies headaches, rhinorrhea, blurry vision, numbness, shortness of breath, chest pain, edema, cough, abdominal pain, nausea, vomiting, diarrhea, dysuria, fevers, rashes or hallucinations unless otherwise stated above in  HPI. ____________________________________________   PHYSICAL EXAM:  VITAL SIGNS: Vitals:   11/18/16 1212  BP: (!) 128/58  Pulse: 70  Resp: 20  Temp: 97.9 F (36.6 C)  SpO2: 100%    Constitutional: Alert and oriented.  in no acute distress. Eyes: Conjunctivae are normal.  Head: Atraumatic. Nose: No congestion/rhinnorhea. Mouth/Throat: Mucous membranes are moist.   Neck: No stridor. Painless ROM.  Cardiovascular: Normal rate, regular rhythm. Grossly normal heart sounds.  Good peripheral circulation. Respiratory: Normal respiratory effort.  No retractions. Lungs CTAB. Gastrointestinal: Soft and nontender. No distention. No abdominal bruits. No CVA tenderness. Genitourinary:  Musculoskeletal: No lower extremity tenderness nor edema.  No joint effusions. Neurologic:  Normal speech and language. No gross focal neurologic deficits are appreciated. No facial droop Skin:  Skin is warm, dry and intact. No rash noted. Psychiatric: Mood and affect are normal. Speech and behavior are normal. ____________________________________________   LABS (all labs ordered are listed, but only abnormal results are displayed)  Results for orders placed or performed during the hospital encounter of 11/18/16 (from the past 24 hour(s))  CBC with Differential/Platelet     Status: Abnormal   Collection Time: 11/18/16 12:09 PM  Result Value Ref Range   WBC 11.5 (H) 3.6 - 11.0 K/uL   RBC 4.55 3.80 - 5.20 MIL/uL   Hemoglobin 13.8 12.0 - 16.0 g/dL   HCT 16.1 09.6 - 04.5 %   MCV 92.4 80.0 - 100.0 fL   MCH 30.3 26.0 - 34.0 pg   MCHC 32.8 32.0 - 36.0 g/dL   RDW 40.9 81.1 - 91.4 %   Platelets 307 150 - 440 K/uL   Neutrophils Relative % 71 %   Neutro Abs 8.3 (H) 1.4 - 6.5 K/uL   Lymphocytes Relative 21 %   Lymphs Abs 2.4 1.0 - 3.6 K/uL   Monocytes Relative 6 %   Monocytes Absolute 0.7 0.2 - 0.9 K/uL   Eosinophils Relative 1 %   Eosinophils Absolute 0.1 0 - 0.7 K/uL   Basophils Relative 1 %    Basophils Absolute 0.1 0 - 0.1 K/uL  Comprehensive metabolic panel     Status: Abnormal   Collection Time: 11/18/16 12:09 PM  Result Value Ref Range   Sodium 136 135 - 145 mmol/L   Potassium 4.1 3.5 - 5.1 mmol/L   Chloride 100 (L) 101 - 111 mmol/L   CO2 24 22 - 32 mmol/L   Glucose, Bld 118 (H) 65 - 99 mg/dL   BUN 24 (H) 6 - 20 mg/dL   Creatinine, Ser 7.82 0.44 - 1.00 mg/dL   Calcium 8.9 8.9 - 95.6 mg/dL   Total Protein 6.6 6.5 - 8.1 g/dL   Albumin 3.7 3.5 - 5.0 g/dL   AST 29 15 - 41 U/L   ALT 37 14 - 54 U/L   Alkaline Phosphatase 62 38 - 126 U/L   Total Bilirubin 0.6 0.3 - 1.2 mg/dL   GFR calc  non Af Amer >60 >60 mL/min   GFR calc Af Amer >60 >60 mL/min   Anion gap 12 5 - 15  Troponin I     Status: None   Collection Time: 11/18/16 12:09 PM  Result Value Ref Range   Troponin I <0.03 <0.03 ng/mL  Urinalysis, Complete w Microscopic     Status: Abnormal   Collection Time: 11/18/16 12:09 PM  Result Value Ref Range   Color, Urine YELLOW (A) YELLOW   APPearance CLEAR (A) CLEAR   Specific Gravity, Urine 1.016 1.005 - 1.030   pH 6.0 5.0 - 8.0   Glucose, UA NEGATIVE NEGATIVE mg/dL   Hgb urine dipstick NEGATIVE NEGATIVE   Bilirubin Urine NEGATIVE NEGATIVE   Ketones, ur 5 (A) NEGATIVE mg/dL   Protein, ur NEGATIVE NEGATIVE mg/dL   Nitrite NEGATIVE NEGATIVE   Leukocytes, UA NEGATIVE NEGATIVE   RBC / HPF 0-5 0 - 5 RBC/hpf   WBC, UA 0-5 0 - 5 WBC/hpf   Bacteria, UA NONE SEEN NONE SEEN   Squamous Epithelial / LPF NONE SEEN NONE SEEN   Mucus PRESENT    ____________________________________________  EKG My review and personal interpretation at Time: 12:12   Indication: near syncope  Rate: 70  Rhythm: sinus Axis: normal Other: poor r wave progression, no stemi, normal intervals ____________________________________________  RADIOLOGY  I personally reviewed all radiographic images ordered to evaluate for the above acute complaints and reviewed radiology reports and findings.  These  findings were personally discussed with the patient.  Please see medical record for radiology report. ____________________________________________   PROCEDURES  Procedure(s) performed:  Procedures    Critical Care performed: no ____________________________________________   INITIAL IMPRESSION / ASSESSMENT AND PLAN / ED COURSE  Pertinent labs & imaging results that were available during my care of the patient were reviewed by me and considered in my medical decision making (see chart for details).  DDX: dehydration, dysrhythmia, seizure, anemia,   Anita Santana is a 76 y.o. who presents to the ED with near syncopal episode as described above.  Patient is well-appearing at this time.  Patient was noted to be orthostatic upon arrival with therefore will give IV fluids.  Blood work ordered for the above differential is reassuring.  Does have mild leukocytosis but I do not appreciate or identify any evidence of infectious process.  She denies any chest pain pressure.  EKG is nonischemic and her troponin is negative.  There is no evidence of urinary tract infection.  She has no focal neuro deficits to suggest need for emergent CT imaging.  Patient improved with IV fluids.  Tolerating oral hydration.  This point do feel patient stable for further workup as an outpatient.  Have discussed with the patient and available family all diagnostics and treatments performed thus far and all questions were answered to the best of my ability. The patient demonstrates understanding and agreement with plan.       ____________________________________________   FINAL CLINICAL IMPRESSION(S) / ED DIAGNOSES  Final diagnoses:  Near syncope  Orthostasis  Dehydration      NEW MEDICATIONS STARTED DURING THIS VISIT:  This SmartLink is deprecated. Use AVSMEDLIST instead to display the medication list for a patient.   Note:  This document was prepared using Dragon voice recognition software and may  include unintentional dictation errors.    Willy Eddyobinson, Buford Gayler, MD 11/18/16 [phone]

## 2016-11-30 ENCOUNTER — Encounter: Payer: Commercial Managed Care - HMO | Admitting: Internal Medicine

## 2016-12-02 DIAGNOSIS — I10 Essential (primary) hypertension: Secondary | ICD-10-CM | POA: Diagnosis not present

## 2016-12-02 DIAGNOSIS — F0280 Dementia in other diseases classified elsewhere without behavioral disturbance: Secondary | ICD-10-CM | POA: Diagnosis not present

## 2016-12-02 DIAGNOSIS — G309 Alzheimer's disease, unspecified: Secondary | ICD-10-CM | POA: Diagnosis not present

## 2016-12-07 DIAGNOSIS — G309 Alzheimer's disease, unspecified: Secondary | ICD-10-CM | POA: Diagnosis not present

## 2016-12-07 DIAGNOSIS — I10 Essential (primary) hypertension: Secondary | ICD-10-CM | POA: Diagnosis not present

## 2016-12-07 DIAGNOSIS — F0280 Dementia in other diseases classified elsewhere without behavioral disturbance: Secondary | ICD-10-CM | POA: Diagnosis not present

## 2016-12-08 ENCOUNTER — Ambulatory Visit (INDEPENDENT_AMBULATORY_CARE_PROVIDER_SITE_OTHER): Payer: Medicare HMO

## 2016-12-08 DIAGNOSIS — Z23 Encounter for immunization: Secondary | ICD-10-CM | POA: Diagnosis not present

## 2016-12-08 NOTE — Progress Notes (Addendum)
Patient can in for 2/2 Shingrix vaccine.  Patient tolerated injection very well. She had no questions, comments, and or concerns at this time.  Reviewed.  Dr Lorin PicketScott

## 2016-12-10 ENCOUNTER — Other Ambulatory Visit: Payer: Self-pay | Admitting: Internal Medicine

## 2016-12-10 DIAGNOSIS — G309 Alzheimer's disease, unspecified: Secondary | ICD-10-CM | POA: Diagnosis not present

## 2016-12-10 DIAGNOSIS — F0280 Dementia in other diseases classified elsewhere without behavioral disturbance: Secondary | ICD-10-CM | POA: Diagnosis not present

## 2016-12-10 DIAGNOSIS — I10 Essential (primary) hypertension: Secondary | ICD-10-CM | POA: Diagnosis not present

## 2016-12-22 DIAGNOSIS — G309 Alzheimer's disease, unspecified: Secondary | ICD-10-CM | POA: Diagnosis not present

## 2016-12-22 DIAGNOSIS — I10 Essential (primary) hypertension: Secondary | ICD-10-CM | POA: Diagnosis not present

## 2016-12-22 DIAGNOSIS — F0280 Dementia in other diseases classified elsewhere without behavioral disturbance: Secondary | ICD-10-CM | POA: Diagnosis not present

## 2017-01-14 DIAGNOSIS — G309 Alzheimer's disease, unspecified: Secondary | ICD-10-CM | POA: Diagnosis not present

## 2017-01-14 DIAGNOSIS — F0280 Dementia in other diseases classified elsewhere without behavioral disturbance: Secondary | ICD-10-CM | POA: Diagnosis not present

## 2017-01-14 DIAGNOSIS — I10 Essential (primary) hypertension: Secondary | ICD-10-CM | POA: Diagnosis not present

## 2017-01-20 DIAGNOSIS — F0280 Dementia in other diseases classified elsewhere without behavioral disturbance: Secondary | ICD-10-CM | POA: Diagnosis not present

## 2017-01-20 DIAGNOSIS — I10 Essential (primary) hypertension: Secondary | ICD-10-CM | POA: Diagnosis not present

## 2017-01-20 DIAGNOSIS — G309 Alzheimer's disease, unspecified: Secondary | ICD-10-CM | POA: Diagnosis not present

## 2017-01-21 DIAGNOSIS — F0150 Vascular dementia without behavioral disturbance: Secondary | ICD-10-CM | POA: Diagnosis not present

## 2017-01-21 DIAGNOSIS — R252 Cramp and spasm: Secondary | ICD-10-CM | POA: Diagnosis not present

## 2017-01-21 DIAGNOSIS — G309 Alzheimer's disease, unspecified: Secondary | ICD-10-CM | POA: Diagnosis not present

## 2017-01-21 DIAGNOSIS — F0280 Dementia in other diseases classified elsewhere without behavioral disturbance: Secondary | ICD-10-CM | POA: Diagnosis not present

## 2017-01-22 DIAGNOSIS — I10 Essential (primary) hypertension: Secondary | ICD-10-CM | POA: Diagnosis not present

## 2017-01-22 DIAGNOSIS — G309 Alzheimer's disease, unspecified: Secondary | ICD-10-CM | POA: Diagnosis not present

## 2017-01-22 DIAGNOSIS — F0280 Dementia in other diseases classified elsewhere without behavioral disturbance: Secondary | ICD-10-CM | POA: Diagnosis not present

## 2017-01-27 DIAGNOSIS — G309 Alzheimer's disease, unspecified: Secondary | ICD-10-CM | POA: Diagnosis not present

## 2017-01-27 DIAGNOSIS — F0280 Dementia in other diseases classified elsewhere without behavioral disturbance: Secondary | ICD-10-CM | POA: Diagnosis not present

## 2017-01-27 DIAGNOSIS — I10 Essential (primary) hypertension: Secondary | ICD-10-CM | POA: Diagnosis not present

## 2017-02-03 DIAGNOSIS — I10 Essential (primary) hypertension: Secondary | ICD-10-CM | POA: Diagnosis not present

## 2017-02-03 DIAGNOSIS — F0280 Dementia in other diseases classified elsewhere without behavioral disturbance: Secondary | ICD-10-CM | POA: Diagnosis not present

## 2017-02-03 DIAGNOSIS — G309 Alzheimer's disease, unspecified: Secondary | ICD-10-CM | POA: Diagnosis not present

## 2017-02-09 DIAGNOSIS — M7062 Trochanteric bursitis, left hip: Secondary | ICD-10-CM | POA: Diagnosis not present

## 2017-02-09 DIAGNOSIS — M62830 Muscle spasm of back: Secondary | ICD-10-CM | POA: Diagnosis not present

## 2017-02-09 DIAGNOSIS — M5416 Radiculopathy, lumbar region: Secondary | ICD-10-CM | POA: Diagnosis not present

## 2017-02-09 DIAGNOSIS — M5136 Other intervertebral disc degeneration, lumbar region: Secondary | ICD-10-CM | POA: Diagnosis not present

## 2017-02-10 DIAGNOSIS — G309 Alzheimer's disease, unspecified: Secondary | ICD-10-CM | POA: Diagnosis not present

## 2017-02-10 DIAGNOSIS — F0280 Dementia in other diseases classified elsewhere without behavioral disturbance: Secondary | ICD-10-CM | POA: Diagnosis not present

## 2017-02-10 DIAGNOSIS — I10 Essential (primary) hypertension: Secondary | ICD-10-CM | POA: Diagnosis not present

## 2017-02-18 DIAGNOSIS — G309 Alzheimer's disease, unspecified: Secondary | ICD-10-CM | POA: Diagnosis not present

## 2017-02-18 DIAGNOSIS — I10 Essential (primary) hypertension: Secondary | ICD-10-CM | POA: Diagnosis not present

## 2017-02-18 DIAGNOSIS — F0280 Dementia in other diseases classified elsewhere without behavioral disturbance: Secondary | ICD-10-CM | POA: Diagnosis not present

## 2017-03-30 ENCOUNTER — Other Ambulatory Visit: Payer: Self-pay | Admitting: Neurology

## 2017-03-30 DIAGNOSIS — R1312 Dysphagia, oropharyngeal phase: Secondary | ICD-10-CM

## 2017-04-09 IMAGING — CR DG LUMBAR SPINE 2-3V
3 series · 3 of 3 positions shown · non-contrast
Comparison: 05/02/2015 lumbar radiographs.

CLINICAL DATA: 74 y/o  F; 2 weeks of left-sided flank pain.

EXAM:
LUMBAR SPINE - 2-3 VIEW

[l-spine ap]
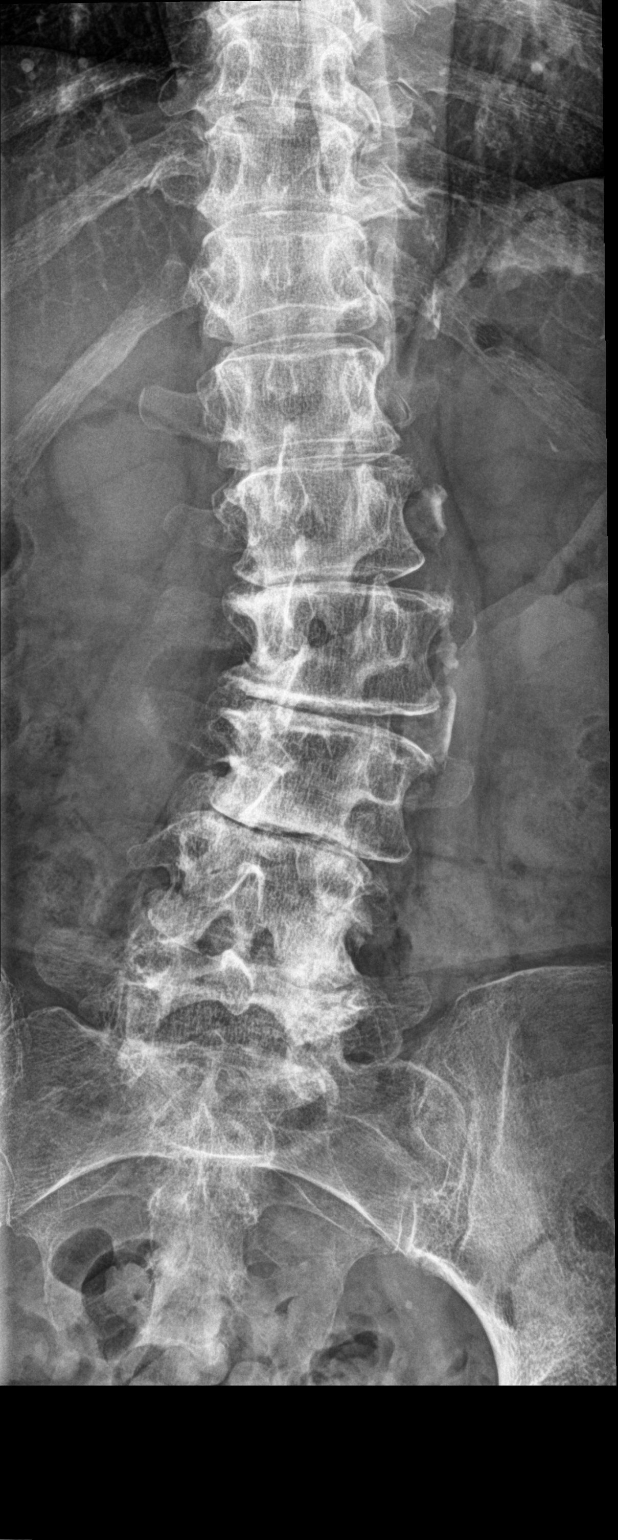

[l-spine lat]
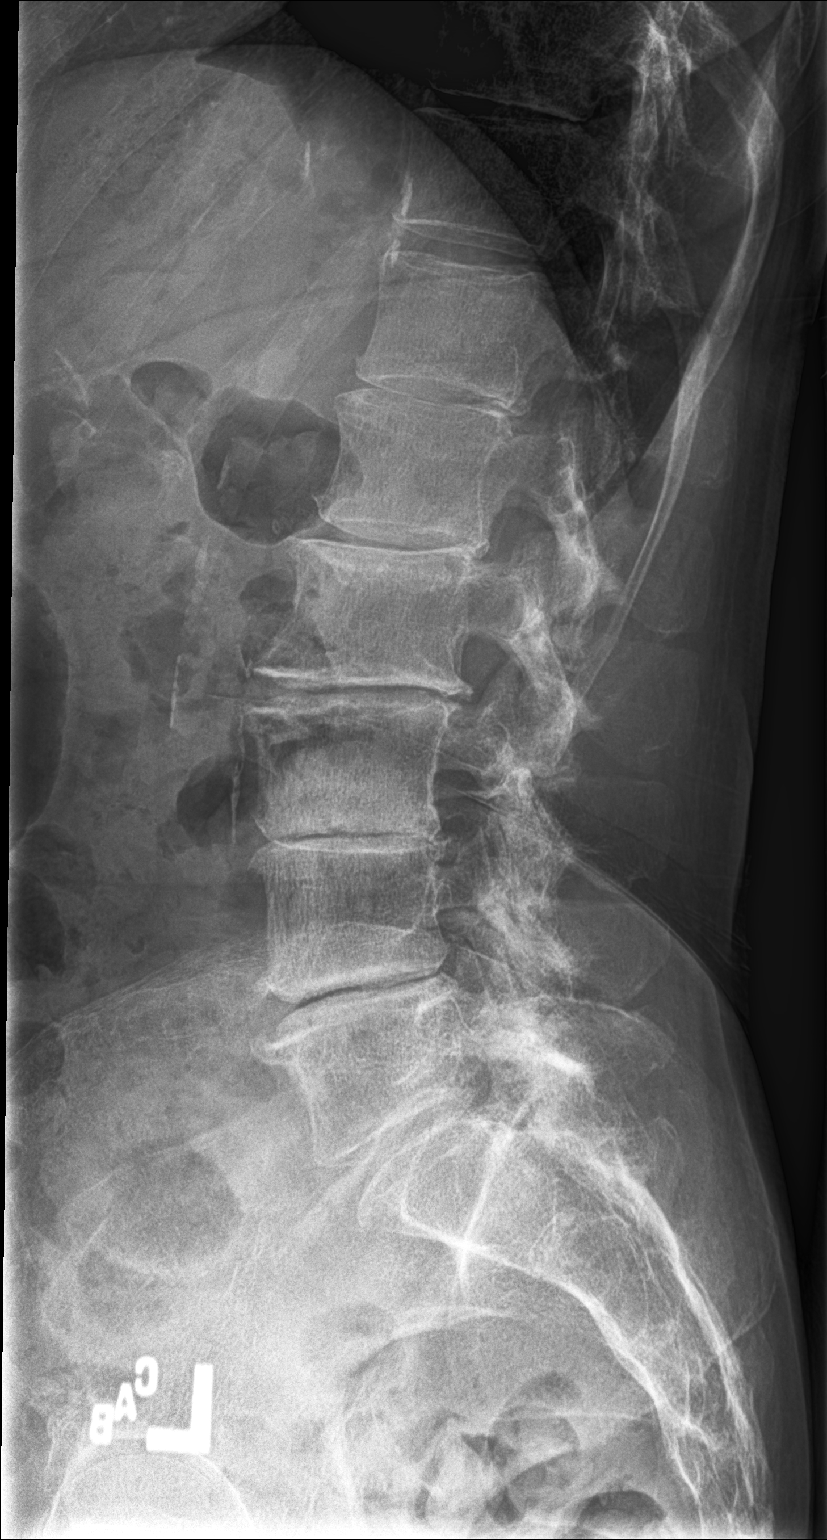

[l-spine spot]
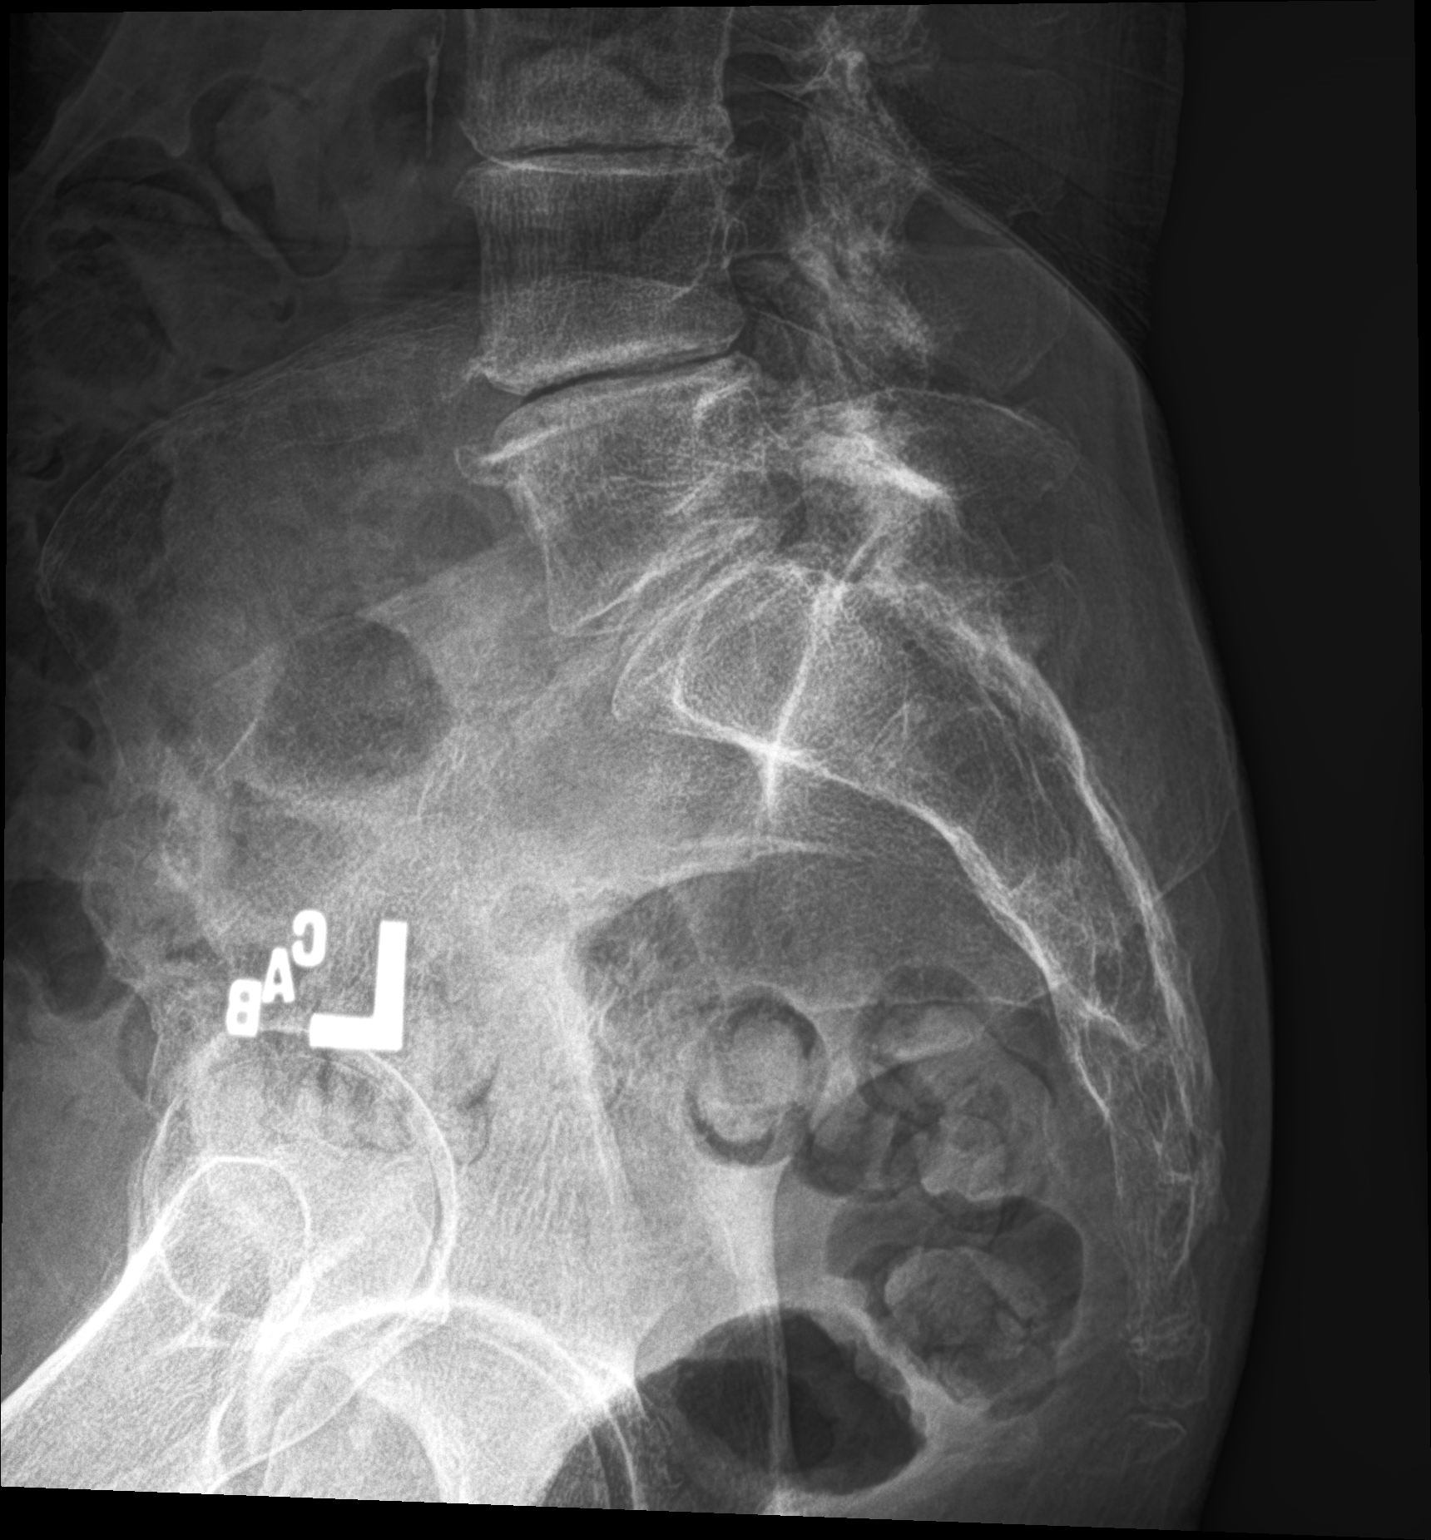

[3 of 3 positions shown; findings below may reference images not displayed]

FINDINGS: Six lumbar type non-rib-bearing vertebral bodies. Twelve paired ribs
on prior chest radiograph. Transitional S1 vertebral body. Moderate
levocurvature with apex at L3. No loss of vertebral body height. No
fracture. Multilevel severe disc space narrowing greatest from L3
through S1. Lower lumbar facet arthrosis. No listhesis.
IMPRESSION: No acute fracture or dislocation. Moderate lumbar levo curvature.
Moderate to severe lumbar spine degenerative changes. No significant
interval change.

By: Faxx Tiger M.D.

## 2017-04-15 ENCOUNTER — Ambulatory Visit: Payer: Medicare HMO

## 2017-04-19 ENCOUNTER — Other Ambulatory Visit: Payer: Self-pay | Admitting: Internal Medicine

## 2017-04-21 ENCOUNTER — Encounter: Payer: Self-pay | Admitting: Internal Medicine

## 2017-04-27 ENCOUNTER — Ambulatory Visit: Payer: Medicare HMO

## 2017-04-27 DIAGNOSIS — M62830 Muscle spasm of back: Secondary | ICD-10-CM | POA: Diagnosis not present

## 2017-04-27 DIAGNOSIS — M5416 Radiculopathy, lumbar region: Secondary | ICD-10-CM | POA: Diagnosis not present

## 2017-04-27 DIAGNOSIS — M5136 Other intervertebral disc degeneration, lumbar region: Secondary | ICD-10-CM | POA: Diagnosis not present

## 2017-04-27 DIAGNOSIS — M7062 Trochanteric bursitis, left hip: Secondary | ICD-10-CM | POA: Diagnosis not present

## 2017-06-07 ENCOUNTER — Encounter: Payer: Self-pay | Admitting: Internal Medicine

## 2017-06-07 NOTE — Telephone Encounter (Signed)
I do not have a problem ordering home health, but I have not see Ms Tennis ShipHorne since 08/2016.  I think I would need documentation as to why home health is needed.  Not sure if I can order without visit.

## 2017-06-08 ENCOUNTER — Telehealth: Payer: Self-pay

## 2017-06-08 NOTE — Telephone Encounter (Signed)
The 4:00 appt is taken.  I have a 12:00 on 07/02/17.  (can hold for work in).  I will not be here part of next week and the whole next week.  See me before calling.

## 2017-06-08 NOTE — Telephone Encounter (Signed)
Copied from CRM [id number]. Topic: Appointment Scheduling - Scheduling Inquiry for Clinic >> Jun 08, 2017  2:16 PM Waymon AmatoBurton, Donna F wrote: Pt daughter has been talking with Dr. Lorin PicketScott about a referral to home health for the patient and stated that she might need to be seen first since she has not been in for a vist since 08/2016 [facility] tried to go ahead and schedule appt but Dr. Lorin PicketScott is booked til Sept if pt does need to be seen can something be seen sooner    Best number [phone] Daughter number

## 2017-06-08 NOTE — Telephone Encounter (Signed)
We have an opening on June 12 at 4:00. Ok to schedule?

## 2017-06-08 NOTE — Telephone Encounter (Signed)
Sent my chart message asking patient to schedule appointment.

## 2017-06-09 NOTE — Telephone Encounter (Signed)
Left message for daughter to call back.  

## 2017-06-09 NOTE — Telephone Encounter (Signed)
The slot on June 28 has been taken. Let me know where to schedule. Also, patients daughter is wanting home health for both of her parents.

## 2017-06-10 NOTE — Telephone Encounter (Signed)
They will both need to be seen.  I can do an 11:30 and 12:00 on 07/06/17.

## 2017-06-10 NOTE — Telephone Encounter (Signed)
PT scheduled per Morrie SheldonAshley

## 2017-06-10 NOTE — Telephone Encounter (Signed)
LMTCB

## 2017-07-05 ENCOUNTER — Ambulatory Visit: Payer: Medicare HMO | Admitting: Internal Medicine

## 2017-07-06 ENCOUNTER — Ambulatory Visit (INDEPENDENT_AMBULATORY_CARE_PROVIDER_SITE_OTHER): Payer: Medicare HMO | Admitting: Internal Medicine

## 2017-07-06 ENCOUNTER — Encounter: Payer: Self-pay | Admitting: Internal Medicine

## 2017-07-06 VITALS — BP 130/70 | HR 64 | Temp 98.1°F | Resp 18 | Wt 145.6 lb

## 2017-07-06 DIAGNOSIS — G309 Alzheimer's disease, unspecified: Secondary | ICD-10-CM | POA: Diagnosis not present

## 2017-07-06 DIAGNOSIS — K219 Gastro-esophageal reflux disease without esophagitis: Secondary | ICD-10-CM | POA: Diagnosis not present

## 2017-07-06 DIAGNOSIS — R413 Other amnesia: Secondary | ICD-10-CM

## 2017-07-06 DIAGNOSIS — R739 Hyperglycemia, unspecified: Secondary | ICD-10-CM

## 2017-07-06 DIAGNOSIS — M545 Low back pain, unspecified: Secondary | ICD-10-CM

## 2017-07-06 DIAGNOSIS — E7800 Pure hypercholesterolemia, unspecified: Secondary | ICD-10-CM

## 2017-07-06 DIAGNOSIS — F0280 Dementia in other diseases classified elsewhere without behavioral disturbance: Secondary | ICD-10-CM

## 2017-07-06 DIAGNOSIS — G8929 Other chronic pain: Secondary | ICD-10-CM | POA: Diagnosis not present

## 2017-07-06 DIAGNOSIS — I10 Essential (primary) hypertension: Secondary | ICD-10-CM | POA: Diagnosis not present

## 2017-07-06 DIAGNOSIS — R252 Cramp and spasm: Secondary | ICD-10-CM | POA: Diagnosis not present

## 2017-07-06 DIAGNOSIS — F0150 Vascular dementia without behavioral disturbance: Secondary | ICD-10-CM | POA: Diagnosis not present

## 2017-07-06 DIAGNOSIS — R42 Dizziness and giddiness: Secondary | ICD-10-CM | POA: Diagnosis not present

## 2017-07-06 LAB — VITAMIN B12: VITAMIN B 12: 709 pg/mL (ref 211–911)

## 2017-07-06 LAB — LIPID PANEL
CHOL/HDL RATIO: 3
Cholesterol: 218 mg/dL — ABNORMAL HIGH (ref 0–200)
HDL: 76 mg/dL (ref >39.00)
LDL CALC: 114 mg/dL — AB (ref 0–99)
NONHDL: 141.8
Triglycerides: 137 mg/dL (ref 0.0–149.0)
VLDL: 27.4 mg/dL (ref 0.0–40.0)

## 2017-07-06 LAB — BASIC METABOLIC PANEL
BUN: 17 mg/dL (ref 6–23)
CALCIUM: 9.6 mg/dL (ref 8.4–10.5)
CHLORIDE: 97 meq/L (ref 96–112)
CO2: 30 mEq/L (ref 19–32)
Creatinine, Ser: 0.68 mg/dL (ref 0.40–1.20)
GFR: 89.25 mL/min (ref >60.00)
Glucose, Bld: 93 mg/dL (ref 70–99)
Potassium: 4.2 mEq/L (ref 3.5–5.1)
SODIUM: 136 meq/L (ref 135–145)

## 2017-07-06 LAB — MAGNESIUM: MAGNESIUM: 2.1 mg/dL (ref 1.5–2.5)

## 2017-07-06 LAB — CBC WITH DIFFERENTIAL/PLATELET
BASOS ABS: 0.1 10*3/uL (ref 0.0–0.1)
Basophils Relative: 0.8 % (ref 0.0–3.0)
Eosinophils Absolute: 0.3 10*3/uL (ref 0.0–0.7)
Eosinophils Relative: 3.4 % (ref 0.0–5.0)
HCT: 39.1 % (ref 36.0–46.0)
Hemoglobin: 13.3 g/dL (ref 12.0–15.0)
LYMPHS ABS: 2.5 10*3/uL (ref 0.7–4.0)
Lymphocytes Relative: 26.6 % (ref 12.0–46.0)
MCHC: 34.1 g/dL (ref 30.0–36.0)
MCV: 91.5 fl (ref 78.0–100.0)
MONO ABS: 0.6 10*3/uL (ref 0.1–1.0)
MONOS PCT: 6.3 % (ref 3.0–12.0)
NEUTROS ABS: 6 10*3/uL (ref 1.4–7.7)
NEUTROS PCT: 62.9 % (ref 43.0–77.0)
PLATELETS: 266 10*3/uL (ref 150.0–400.0)
RBC: 4.27 Mil/uL (ref 3.87–5.11)
RDW: 13.6 % (ref 11.5–15.5)
WBC: 9.5 10*3/uL (ref 4.0–10.5)

## 2017-07-06 LAB — HEPATIC FUNCTION PANEL
ALBUMIN: 4.5 g/dL (ref 3.5–5.2)
ALT: 20 U/L (ref 0–35)
AST: 21 U/L (ref 0–37)
Alkaline Phosphatase: 73 U/L (ref 39–117)
BILIRUBIN DIRECT: 0.1 mg/dL (ref 0.0–0.3)
BILIRUBIN TOTAL: 0.8 mg/dL (ref 0.2–1.2)
Total Protein: 7.1 g/dL (ref 6.0–8.3)

## 2017-07-06 LAB — TSH: TSH: 1.81 u[IU]/mL (ref 0.35–4.50)

## 2017-07-06 LAB — HEMOGLOBIN A1C: HEMOGLOBIN A1C: 5.7 % (ref 4.6–6.5)

## 2017-07-06 NOTE — Progress Notes (Signed)
Patient ID: Anita Santana, female   DOB: Jun 09, 1940, 77 y.o.   MRN: [id number]   Subjective:    Patient ID: Anita Santana, female    DOB: 01/05/1941, 77 y.o.   MRN: [id number]  HPI  Patient here for a scheduled follow up. Was having back pain and left hip pain.  Saw Dr Sharlet Salina.  Recommended continuing tylenol, naproxen and home exercise.  S/p injection.  Doing better.  Able to ambulate without significant pain.  Has noticed some cramps in her feet.  Discussed stretches.  Taking magnesium and miralax for her bowels.  Bowels doing better.  No abdominal pain.  No chest pain.  Breathing stable.  Some weakness.  Discussed using a cane or walker.  Discussed therapy.  Has mixed dementia.  Sees neurology.     Past Medical History:  Diagnosis Date  . Angioedema   . Arthritis   . Dementia   . Diverticulosis of colon 02/08/12   Colonoscopy  . GERD (gastroesophageal reflux disease)   . Hypercholesteremia   . Hypertension   . Psoriasis   . Syncope and collapse   . TIA (transient ischemic attack)    Past Surgical History:  Procedure Laterality Date  . cataract surgery  2012  . COLONOSCOPY WITH PROPOFOL N/A 07/01/2015   Procedure: COLONOSCOPY WITH PROPOFOL;  Surgeon: Lollie Sails, MD;  Location: [facility] ENDOSCOPY;  Service: Endoscopy;  Laterality: N/A;  . COLONOSCOPY WITH PROPOFOL N/A 07/04/2015   Procedure: COLONOSCOPY WITH PROPOFOL;  Surgeon: Lollie Sails, MD;  Location: [facility] ENDOSCOPY;  Service: Endoscopy;  Laterality: N/A;  . EYE SURGERY    . TONSILECTOMY/ADENOIDECTOMY WITH MYRINGOTOMY  1950  . TONSILLECTOMY     Family History  Problem Relation Age of Onset  . Hypercholesterolemia Mother   . Stroke Mother   . Hypertension Mother   . Cancer Mother        ovarian/uterus  . Hypercholesterolemia Father   . Heart disease Father   . Heart attack Father   . Cancer Maternal Grandmother        unsure of what type   Social History   Socioeconomic History  . Marital status: Married      Spouse name: Not on file  . Number of children: Not on file  . Years of education: Not on file  . Highest education level: Not on file  Occupational History  . Not on file  Social Needs  . Financial resource strain: Not on file  . Food insecurity:    Worry: Not on file    Inability: Not on file  . Transportation needs:    Medical: Not on file    Non-medical: Not on file  Tobacco Use  . Smoking status: Never Smoker  . Smokeless tobacco: Never Used  Substance and Sexual Activity  . Alcohol use: No    Alcohol/week: 0.0 oz  . Drug use: No  . Sexual activity: Not Currently  Lifestyle  . Physical activity:    Days per week: Not on file    Minutes per session: Not on file  . Stress: Not on file  Relationships  . Social connections:    Talks on phone: Not on file    Gets together: Not on file    Attends religious service: Not on file    Active member of club or organization: Not on file    Attends meetings of clubs or organizations: Not on file    Relationship status: Not on file  Other  Topics Concern  . Not on file  Social History Narrative  . Not on file    Outpatient Encounter Medications as of 07/06/2017  Medication Sig  . aspirin EC 81 MG tablet Take 81 mg by mouth.  . traMADol (ULTRAM) 50 MG tablet Take 1 tablet by mouth 2 (two) times daily as needed.  Marland Kitchen acetaminophen (TYLENOL ARTHRITIS PAIN) 650 MG CR tablet Take 650 mg by mouth as needed. Reported on 01/24/2015  . atorvastatin (LIPITOR) 20 MG tablet TAKE 1 TABLET EVERY DAY (NEED APPOINTMENT FOR ADDITIONAL REFILLS)  . baclofen (LIORESAL) 10 MG tablet Take 5 mg 2 (two) times daily by mouth.  . docusate sodium (COLACE CLEAR) 50 MG capsule Take by mouth.  . galantamine (RAZADYNE) 12 MG tablet Take 12 mg by mouth 2 (two) times daily.  Marland Kitchen ibuprofen (ADVIL,MOTRIN) 200 MG tablet Take 400 mg by mouth 2 (two) times daily as needed.  Marland Kitchen lisinopril (PRINIVIL,ZESTRIL) 40 MG tablet TAKE 1 TABLET EVERY DAY (NEED APPOINTMENT FOR  FURTHER REFILLS)  . Magnesium 250 MG TABS Take 500 mg by mouth daily.  . memantine (NAMENDA) 5 MG tablet Take 1 tablet 2 (two) times daily by mouth.  . multivitamin-iron-minerals-folic acid (CENTRUM) chewable tablet Chew by mouth.  . pantoprazole (PROTONIX) 40 MG tablet TAKE 1 TABLET EVERY DAY   No facility-administered encounter medications on file as of 07/06/2017.     Review of Systems  Constitutional: Negative for appetite change and unexpected weight change.       Eating better.    HENT: Negative for congestion and sinus pressure.   Respiratory: Negative for cough, chest tightness and shortness of breath.   Cardiovascular: Negative for chest pain, palpitations and leg swelling.  Gastrointestinal: Negative for abdominal pain, nausea and vomiting.       Bowels doing better.   Genitourinary: Negative for difficulty urinating and dysuria.  Musculoskeletal: Negative for myalgias.       Back and hip pain.  Saw Dr Sharlet Salina.  S/p injection.  Feet cramps - as outlined.    Skin: Negative for color change and rash.  Neurological: Negative for dizziness, light-headedness and headaches.  Psychiatric/Behavioral: Negative for agitation and dysphoric mood.       Objective:    Physical Exam  Constitutional: She appears well-developed and well-nourished. No distress.  HENT:  Nose: Nose normal.  Mouth/Throat: Oropharynx is clear and moist.  Neck: Neck supple. No thyromegaly present.  Cardiovascular: Normal rate and regular rhythm.  Pulmonary/Chest: Breath sounds normal. No respiratory distress. She has no wheezes.  Abdominal: Soft. Bowel sounds are normal. There is no tenderness.  Musculoskeletal: She exhibits no edema or tenderness.  Lymphadenopathy:    She has no cervical adenopathy.  Skin: No rash noted. No erythema.  Psychiatric: She has a normal mood and affect. Her behavior is normal.    BP 130/70 (BP Location: Left Arm, Patient Position: Sitting, Cuff Size: Normal)   Pulse 64    Temp 98.1 F (36.7 C) (Oral)   Resp 18   Wt 145 lb 9.6 oz (66 kg)   SpO2 99%   BMI 23.50 kg/m  Wt Readings from Last 3 Encounters:  07/06/17 145 lb 9.6 oz (66 kg)  11/18/16 130 lb (59 kg)  10/09/16 125 lb (56.7 kg)     Lab Results  Component Value Date   WBC 9.5 07/06/2017   HGB 13.3 07/06/2017   HCT 39.1 07/06/2017   PLT 266.0 07/06/2017   GLUCOSE 93 07/06/2017   CHOL 218 (  H) 07/06/2017   TRIG 137.0 07/06/2017   HDL 76.00 07/06/2017   LDLDIRECT 237.0 07/15/2012   LDLCALC 114 (H) 07/06/2017   ALT 20 07/06/2017   AST 21 07/06/2017   NA 136 07/06/2017   K 4.2 07/06/2017   CL 97 07/06/2017   CREATININE 0.68 07/06/2017   BUN 17 07/06/2017   CO2 30 07/06/2017   TSH 1.81 07/06/2017   HGBA1C 5.7 07/06/2017    Dg Chest 1 View  Result Date: 11/18/2016 CLINICAL DATA:  Syncope, evaluate for CHF EXAM: CHEST 1 VIEW COMPARISON:  05/02/2015 FINDINGS: There is no focal parenchymal opacity. There is no pleural effusion or pneumothorax. The heart and mediastinal contours are unremarkable. There is a dextrocurvature of the thoracic spine. IMPRESSION: No active disease. Electronically Signed   By: Kathreen Devoid   On: 11/18/2016 12:58       Assessment & Plan:   Problem List Items Addressed This Visit    Back pain    Was seen in Dr Sharlet Salina office.  S/p injection.  Doing some better.  With weakness and some unsteadiness.  Uses a cane.  Will have physical therapy evaluate and treat.        Relevant Medications   aspirin EC 81 MG tablet   traMADol (ULTRAM) 50 MG tablet   Dizziness - Primary    Intermittent. Discussed slow position changes and movement.  Discussed physical therapy for gait instability and strengthening.        Relevant Orders   Ambulatory referral to [facility]   GERD (gastroesophageal reflux disease)    Controlled on protonix.        Relevant Medications   docusate sodium (COLACE CLEAR) 50 MG capsule   Hypercholesteremia    On lipitor.  Follow lipid panel  and liver function tests.        Relevant Medications   aspirin EC 81 MG tablet   Other Relevant Orders   Hepatic function panel (Completed)   Lipid panel (Completed)   Hyperglycemia    Follow met b and a1c.        Relevant Orders   Hemoglobin A1c (Completed)   Hypertension    Blood pressure under good control.  Continue same medication regimen.  Follow pressures.  Follow metabolic panel.        Relevant Medications   aspirin EC 81 MG tablet   Other Relevant Orders   TSH (Completed)   CBC with Differential/Platelet (Completed)   Basic metabolic panel (Completed)   Mixed Alzheimer's and vascular dementia    Has been evaluated by Dr Manuella Ghazi.  On namenda.        Relevant Medications   aspirin EC 81 MG tablet    Other Visit Diagnoses    Memory change       Relevant Orders   Vitamin B12 (Completed)   Leg cramps       Relevant Orders   Magnesium (Completed)       Einar Pheasant, MD

## 2017-07-07 ENCOUNTER — Encounter: Payer: Self-pay | Admitting: Internal Medicine

## 2017-07-09 ENCOUNTER — Telehealth: Payer: Self-pay

## 2017-07-11 ENCOUNTER — Encounter: Payer: Self-pay | Admitting: Internal Medicine

## 2017-07-11 NOTE — Assessment & Plan Note (Signed)
Follow met b and a1c.

## 2017-07-11 NOTE — Assessment & Plan Note (Signed)
On lipitor.  Follow lipid panel and liver function tests.   

## 2017-07-11 NOTE — Assessment & Plan Note (Signed)
Has been evaluated by Dr Sherryll BurgerShah.  On namenda.

## 2017-07-11 NOTE — Assessment & Plan Note (Signed)
Controlled on protonix.   

## 2017-07-11 NOTE — Assessment & Plan Note (Signed)
Blood pressure under good control.  Continue same medication regimen.  Follow pressures.  Follow metabolic panel.   

## 2017-07-11 NOTE — Assessment & Plan Note (Signed)
Was seen in Dr Yves Dillhasnis office.  S/p injection.  Doing some better.  With weakness and some unsteadiness.  Uses a cane.  Will have physical therapy evaluate and treat.

## 2017-07-11 NOTE — Assessment & Plan Note (Signed)
Intermittent. Discussed slow position changes and movement.  Discussed physical therapy for gait instability and strengthening.

## 2017-07-13 ENCOUNTER — Encounter: Payer: Self-pay | Admitting: Internal Medicine

## 2017-07-16 ENCOUNTER — Telehealth: Payer: Self-pay | Admitting: Internal Medicine

## 2017-07-16 DIAGNOSIS — F0280 Dementia in other diseases classified elsewhere without behavioral disturbance: Secondary | ICD-10-CM | POA: Diagnosis not present

## 2017-07-16 DIAGNOSIS — F0150 Vascular dementia without behavioral disturbance: Secondary | ICD-10-CM | POA: Diagnosis not present

## 2017-07-16 DIAGNOSIS — M545 Low back pain: Secondary | ICD-10-CM | POA: Diagnosis not present

## 2017-07-16 DIAGNOSIS — I10 Essential (primary) hypertension: Secondary | ICD-10-CM | POA: Diagnosis not present

## 2017-07-16 DIAGNOSIS — G309 Alzheimer's disease, unspecified: Secondary | ICD-10-CM | POA: Diagnosis not present

## 2017-07-16 DIAGNOSIS — R42 Dizziness and giddiness: Secondary | ICD-10-CM | POA: Diagnosis not present

## 2017-07-16 NOTE — Telephone Encounter (Signed)
Ok to give verbal 

## 2017-07-16 NOTE — Telephone Encounter (Unsigned)
Copied from CRM [phone]#[id number]. Topic: Quick Communication - See Telephone Encounter >> Jul 16, 2017 11:46 AM Floria RavelingStovall, Shana A wrote: CRM for notification. See Telephone encounter for: 07/16/17. Kelly carter with [facility] [phone] Need verbals for PT  2 week 4

## 2017-07-16 NOTE — Telephone Encounter (Signed)
Verbal orders given to kelly by Olegario MessierKathy.

## 2017-07-20 ENCOUNTER — Other Ambulatory Visit: Payer: Self-pay | Admitting: Internal Medicine

## 2017-07-20 DIAGNOSIS — G309 Alzheimer's disease, unspecified: Secondary | ICD-10-CM | POA: Diagnosis not present

## 2017-07-20 DIAGNOSIS — F0150 Vascular dementia without behavioral disturbance: Secondary | ICD-10-CM | POA: Diagnosis not present

## 2017-07-20 DIAGNOSIS — R42 Dizziness and giddiness: Secondary | ICD-10-CM | POA: Diagnosis not present

## 2017-07-20 DIAGNOSIS — F0280 Dementia in other diseases classified elsewhere without behavioral disturbance: Secondary | ICD-10-CM | POA: Diagnosis not present

## 2017-07-20 DIAGNOSIS — I10 Essential (primary) hypertension: Secondary | ICD-10-CM | POA: Diagnosis not present

## 2017-07-20 DIAGNOSIS — M545 Low back pain: Secondary | ICD-10-CM | POA: Diagnosis not present

## 2017-07-22 DIAGNOSIS — R42 Dizziness and giddiness: Secondary | ICD-10-CM | POA: Diagnosis not present

## 2017-07-22 DIAGNOSIS — I10 Essential (primary) hypertension: Secondary | ICD-10-CM | POA: Diagnosis not present

## 2017-07-22 DIAGNOSIS — F0280 Dementia in other diseases classified elsewhere without behavioral disturbance: Secondary | ICD-10-CM | POA: Diagnosis not present

## 2017-07-22 DIAGNOSIS — G309 Alzheimer's disease, unspecified: Secondary | ICD-10-CM | POA: Diagnosis not present

## 2017-07-22 DIAGNOSIS — F0150 Vascular dementia without behavioral disturbance: Secondary | ICD-10-CM | POA: Diagnosis not present

## 2017-07-22 DIAGNOSIS — M545 Low back pain: Secondary | ICD-10-CM | POA: Diagnosis not present

## 2017-07-26 DIAGNOSIS — F0280 Dementia in other diseases classified elsewhere without behavioral disturbance: Secondary | ICD-10-CM | POA: Diagnosis not present

## 2017-07-26 DIAGNOSIS — M545 Low back pain: Secondary | ICD-10-CM | POA: Diagnosis not present

## 2017-07-26 DIAGNOSIS — G309 Alzheimer's disease, unspecified: Secondary | ICD-10-CM | POA: Diagnosis not present

## 2017-07-26 DIAGNOSIS — R42 Dizziness and giddiness: Secondary | ICD-10-CM | POA: Diagnosis not present

## 2017-07-26 DIAGNOSIS — I10 Essential (primary) hypertension: Secondary | ICD-10-CM | POA: Diagnosis not present

## 2017-07-26 DIAGNOSIS — F0150 Vascular dementia without behavioral disturbance: Secondary | ICD-10-CM | POA: Diagnosis not present

## 2017-07-27 ENCOUNTER — Emergency Department: Payer: Medicare HMO

## 2017-07-27 ENCOUNTER — Emergency Department
Admission: EM | Admit: 2017-07-27 | Discharge: 2017-07-27 | Disposition: A | Discharge status: Home / Self Care | Payer: Medicare HMO | Attending: Emergency Medicine | Admitting: Emergency Medicine

## 2017-07-27 ENCOUNTER — Encounter: Payer: Self-pay | Admitting: Emergency Medicine

## 2017-07-27 DIAGNOSIS — Z79899 Other long term (current) drug therapy: Secondary | ICD-10-CM | POA: Insufficient documentation

## 2017-07-27 DIAGNOSIS — Z7982 Long term (current) use of aspirin: Secondary | ICD-10-CM | POA: Diagnosis not present

## 2017-07-27 DIAGNOSIS — R079 Chest pain, unspecified: Secondary | ICD-10-CM | POA: Diagnosis not present

## 2017-07-27 DIAGNOSIS — R1013 Epigastric pain: Secondary | ICD-10-CM | POA: Insufficient documentation

## 2017-07-27 DIAGNOSIS — F0150 Vascular dementia without behavioral disturbance: Secondary | ICD-10-CM | POA: Diagnosis not present

## 2017-07-27 DIAGNOSIS — I10 Essential (primary) hypertension: Secondary | ICD-10-CM | POA: Insufficient documentation

## 2017-07-27 DIAGNOSIS — G309 Alzheimer's disease, unspecified: Secondary | ICD-10-CM | POA: Insufficient documentation

## 2017-07-27 DIAGNOSIS — M62830 Muscle spasm of back: Secondary | ICD-10-CM | POA: Diagnosis not present

## 2017-07-27 DIAGNOSIS — R0789 Other chest pain: Secondary | ICD-10-CM | POA: Diagnosis present

## 2017-07-27 DIAGNOSIS — R071 Chest pain on breathing: Secondary | ICD-10-CM | POA: Diagnosis not present

## 2017-07-27 DIAGNOSIS — Z8673 Personal history of transient ischemic attack (TIA), and cerebral infarction without residual deficits: Secondary | ICD-10-CM | POA: Insufficient documentation

## 2017-07-27 DIAGNOSIS — R0782 Intercostal pain: Secondary | ICD-10-CM | POA: Diagnosis not present

## 2017-07-27 DIAGNOSIS — R251 Tremor, unspecified: Secondary | ICD-10-CM | POA: Diagnosis not present

## 2017-07-27 DIAGNOSIS — R001 Bradycardia, unspecified: Secondary | ICD-10-CM | POA: Diagnosis not present

## 2017-07-27 DIAGNOSIS — F0280 Dementia in other diseases classified elsewhere without behavioral disturbance: Secondary | ICD-10-CM | POA: Diagnosis not present

## 2017-07-27 DIAGNOSIS — N200 Calculus of kidney: Secondary | ICD-10-CM | POA: Diagnosis not present

## 2017-07-27 LAB — BASIC METABOLIC PANEL
Anion gap: 8 (ref 5–15)
BUN: 13 mg/dL (ref 8–23)
CHLORIDE: 100 mmol/L (ref 98–111)
CO2: 30 mmol/L (ref 22–32)
Calcium: 9.8 mg/dL (ref 8.9–10.3)
Creatinine, Ser: 0.85 mg/dL (ref 0.44–1.00)
GFR calc Af Amer: >60 mL/min (ref >60)
GFR calc non Af Amer: >60 mL/min (ref >60)
GLUCOSE: 112 mg/dL — AB (ref 70–99)
POTASSIUM: 4.6 mmol/L (ref 3.5–5.1)
Sodium: 138 mmol/L (ref 135–145)

## 2017-07-27 LAB — TROPONIN I
Troponin I: <0.03 ng/mL (ref <0.03)
Troponin I: <0.03 ng/mL (ref <0.03)

## 2017-07-27 LAB — CBC
HEMATOCRIT: 40.9 % (ref 35.0–47.0)
Hemoglobin: 14.1 g/dL (ref 12.0–16.0)
MCH: 31.4 pg (ref 26.0–34.0)
MCHC: 34.4 g/dL (ref 32.0–36.0)
MCV: 91.3 fL (ref 80.0–100.0)
PLATELETS: 231 10*3/uL (ref 150–440)
RBC: 4.47 MIL/uL (ref 3.80–5.20)
RDW: 14 % (ref 11.5–14.5)
WBC: 10 10*3/uL (ref 3.6–11.0)

## 2017-07-27 MED ORDER — MORPHINE SULFATE (PF) 2 MG/ML IV SOLN
2.0000 mg | Freq: Once | INTRAVENOUS | Status: AC
  Administered 2017-07-27: 2 mg via INTRAVENOUS
  Filled 2017-07-27: qty 1

## 2017-07-27 MED ORDER — IOPAMIDOL (ISOVUE-370) INJECTION 76%
100.0000 mL | Freq: Once | INTRAVENOUS | Status: AC | PRN
  Administered 2017-07-27: 100 mL via INTRAVENOUS

## 2017-07-27 MED ORDER — ONDANSETRON HCL 4 MG/2ML IJ SOLN: 4.0000 mg | Freq: Once | INTRAMUSCULAR | Status: DC

## 2017-07-27 MED ORDER — MORPHINE SULFATE (PF) 2 MG/ML IV SOLN: 2.0000 mg | Freq: Once | INTRAVENOUS | Status: DC

## 2017-07-27 MED ORDER — IOPAMIDOL (ISOVUE-300) INJECTION 61%
30.0000 mL | Freq: Once | INTRAVENOUS | Status: AC | PRN
  Administered 2017-07-27: 30 mL via ORAL

## 2017-07-27 MED ORDER — ONDANSETRON HCL 4 MG/2ML IJ SOLN
4.0000 mg | Freq: Once | INTRAMUSCULAR | Status: AC
  Administered 2017-07-27: 4 mg via INTRAVENOUS
  Filled 2017-07-27: qty 2

## 2017-07-27 NOTE — Discharge Instructions (Signed)
CAT scan shows narrowing of 1 of the nerve root spaces on the left side.  This is the most likely explanation for the sharp stabbing pain that you are having.  Please follow-up with orthopedic.  Dr. Joice LoftsPoggi is on call.  Dr. Rosita KeaMenz may also see you.  Your regular doctor may also be on a help.  In the meantime Tylenol for pain.  Return for worse pain fever or vomiting.

## 2017-07-27 NOTE — ED Notes (Signed)
Pt in CT at this time.

## 2017-07-27 NOTE — ED Notes (Signed)
Placed PT on bed pan.Sent urine sample to lab.

## 2017-07-27 NOTE — ED Triage Notes (Signed)
Pt reports this AM started with sharp pain under her left breast. Pt reports pain comes and goes but when it hits it takes her breath away.

## 2017-07-27 NOTE — ED Notes (Signed)
Pts family provided with juice and crackers. Family aware that pt cannot eat or drink outside of oral contrast until all tests are completed and resulted. Family verbalized understanding.

## 2017-07-27 NOTE — ED Provider Notes (Signed)
[facility] Emergency Department Provider Note   ____________________________________________   First MD Initiated Contact with Patient 07/27/17 1107     (approximate)  I have reviewed the triage vital signs and the nursing notes.   HISTORY  Chief Complaint Chest Pain   HPI Anita Santana is a 76 y.o. female who woke up this morning and got dressed with her husband's help to go to a doctor's appointment she had some sharp stabbing pains in the left side of her chest radiating around under the breast when she was getting dressed but as she came to the hospital and was in the hospital in the doctor's office she began to have more more pain from the back to the front and also in the abdomen.  She is now complaining of intermittent sharp severe pains in the back mostly sometimes around the side of the chest in the epigastric area and lower down the abdomen as well.  He has never had this before.  He is not short of breath.  The pain is made worse with movement and with deep breathing.  Pain is severe when it occurs.  The abdominal pain is more prolonged than the chest pain.  Past Medical History:  Diagnosis Date  . Angioedema   . Arthritis   . Dementia   . Diverticulosis of colon 02/08/12   Colonoscopy  . GERD (gastroesophageal reflux disease)   . Hypercholesteremia   . Hypertension   . Psoriasis   . Syncope and collapse   . TIA (transient ischemic attack)     Patient Active Problem List   Diagnosis Date Noted  . Dizziness 08/18/2016  . Back pain 11/02/2015  . LLQ abdominal pain 04/26/2015  . Health care maintenance 02/06/2014  . Hyperglycemia 02/06/2014  . Stress 08/04/2012  . Abnormal mammogram 07/17/2012  . 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 Diverticulosis of colon without hemorrhage 02/15/2012  . Dysrhythmia, cardiac 02/09/2012  . Hypertension 11/06/2011  . GERD (gastroesophageal reflux disease) 11/06/2011  . TIA (transient  ischemic attack) 11/06/2011  . Hypercholesteremia 11/05/2011    Past Surgical History:  Procedure Laterality Date  . cataract surgery  2012  . COLONOSCOPY WITH PROPOFOL N/A 07/01/2015   Procedure: COLONOSCOPY WITH PROPOFOL;  Surgeon: Christena Deem, MD;  Location: [facility] ENDOSCOPY;  Service: Endoscopy;  Laterality: N/A;  . COLONOSCOPY WITH PROPOFOL N/A 07/04/2015   Procedure: COLONOSCOPY WITH PROPOFOL;  Surgeon: Christena Deem, MD;  Location: [facility] ENDOSCOPY;  Service: Endoscopy;  Laterality: N/A;  . EYE SURGERY    . TONSILECTOMY/ADENOIDECTOMY WITH MYRINGOTOMY  1950  . TONSILLECTOMY      Prior to Admission medications   Medication Sig Start Date End Date Taking? Authorizing Provider  acetaminophen (TYLENOL ARTHRITIS PAIN) 650 MG CR tablet Take 650 mg by mouth as needed. Reported on 01/24/2015    [provider]  aspirin EC 81 MG tablet Take 81 mg by mouth. 01/21/17   [provider]  atorvastatin (LIPITOR) 20 MG tablet Take 1 tablet (20 mg total) by mouth daily. 07/20/17   Dale Clarendon, MD  baclofen (LIORESAL) 10 MG tablet Take 5 mg 2 (two) times daily by mouth.    [provider]  docusate sodium (COLACE CLEAR) 50 MG capsule Take by mouth.    [provider]  galantamine (RAZADYNE) 12 MG tablet Take 12 mg by mouth 2 (two) times daily.    [provider]  ibuprofen (ADVIL,MOTRIN) 200 MG tablet Take 400 mg  by mouth 2 (two) times daily as needed.    [provider]  lisinopril (PRINIVIL,ZESTRIL) 40 MG tablet Take 1 tablet (40 mg total) by mouth daily. 07/20/17   Dale DurhamScott, Charlene, MD  Magnesium 250 MG TABS Take 500 mg by mouth daily.    [provider]  memantine (NAMENDA) 5 MG tablet Take 1 tablet 2 (two) times daily by mouth. 08/13/16   [provider]  multivitamin-iron-minerals-folic acid (CENTRUM) chewable tablet Chew by mouth.    [provider]  pantoprazole (PROTONIX) 40 MG tablet TAKE 1 TABLET EVERY DAY  07/20/17   Dale DurhamScott, Charlene, MD  traMADol (ULTRAM) 50 MG tablet Take 1 tablet by mouth 2 (two) times daily as needed. 05/07/17   [provider]    Allergies Plavix [clopidogrel bisulfate] and Penicillins  Family History  Problem Relation Age of Onset  . Hypercholesterolemia Mother   . Stroke Mother   . Hypertension Mother   . Cancer Mother        ovarian/uterus  . Hypercholesterolemia Father   . Heart disease Father   . Heart attack Father   . Cancer Maternal Grandmother        unsure of what type    Social History Social History   Tobacco Use  . Smoking status: Never Smoker  . Smokeless tobacco: Never Used  Substance Use Topics  . Alcohol use: No    Alcohol/week: 0.0 oz  . Drug use: No    Review of Systems  Constitutional: No fever/chills Eyes: No visual changes. ENT: No sore throat. Cardiovascular: See HPI Respiratory: Denies shortness of breath. Gastrointestinal:  abdominal pain.  No nausea, no vomiting.  No diarrhea.  No constipation. Genitourinary: Negative for dysuria. Musculoskeletal: Negative for back pain. Skin: Negative for rash. Neurological : No focal complaints. ____________________________________________   PHYSICAL EXAM:  VITAL SIGNS: ED Triage Vitals  Enc Vitals Group     BP 07/27/17 0932 (!) 144/68     Pulse Rate 07/27/17 0932 62     Resp 07/27/17 0932 16     Temp 07/27/17 0932 98.2 F (36.8 C)     Temp Source 07/27/17 0932 Oral     SpO2 07/27/17 0932 100 %     Weight 07/27/17 0923 144 lb 12.8 oz (65.7 kg)     Height 07/27/17 0923 5\' 7"  (1.702 m)     Head Circumference --      Peak Flow --      Pain Score 07/27/17 0923 10     Pain Loc --      Pain Edu? --      Excl. in GC? --     Constitutional: Alert and oriented. Well appearing and in no acute distress. Eyes: Conjunctivae are normal.  Head: Atraumatic. Nose: No congestion/rhinnorhea. Mouth/Throat: Mucous membranes are moist.  Oropharynx non-erythematous. Neck: No  stridor.  Cardiovascular: Normal rate, regular rhythm. Grossly normal heart sounds.  Good peripheral circulation. Respiratory: Normal respiratory effort.  No retractions. Lungs CTAB.  Chest is slightly tender to lateral compression there is no rash on the skin. Gastrointestinal: Soft diffusely tender to palpation but not percussion.. No distention. No abdominal bruits. No CVA tenderness. Musculoskeletal: No lower extremity tenderness nor edema.  No joint effusions. Neurologic:  Normal speech and language. No gross focal neurologic deficits are appreciated. Skin:  Skin is warm, dry and intact. No rash noted. Psychiatric: Mood and affect are normal. Speech and behavior are normal.  ____________________________________________   LABS (all labs ordered are listed,  but only abnormal results are displayed)  Labs Reviewed  BASIC METABOLIC PANEL - Abnormal; Notable for the following components:      Result Value   Glucose, Bld 112 (*)    All other components within normal limits  CBC  TROPONIN I  TROPONIN I   ____________________________________________  EKG  EKG read interpreted by me shows sinus bradycardia rate of 57 left axis no acute changes ____________________________________________  RADIOLOGY  ED MD interpretation: Chest x-ray read interpreted by radiology reviewed by me shows no acute disease.  CT scan showed only narrowing of the 11 XII nerve root on the left side.  This may explain the patient's pain.  There is nothing else there to do so.  Official radiology report(s): Dg Chest 2 View  Result Date: 07/27/2017 CLINICAL DATA:  Left-sided chest pain. EXAM: CHEST - 2 VIEW COMPARISON:  11/18/2016. FINDINGS: Mediastinum hilar structures normal. Lungs are clear. No pleural effusion or pneumothorax. Heart size normal. Thoracic spine scoliosis and degenerative change. IMPRESSION: No acute cardiopulmonary disease. Electronically Signed   By: Maisie Fus  Register   On: 07/27/2017 09:54    Ct Angio Chest Pe W And/or Wo Contrast  Result Date: 07/27/2017 CLINICAL DATA:  New onset pleuritic left-sided chest pain radiating to the back. EXAM: CT ANGIOGRAPHY CHEST CT ABDOMEN AND PELVIS WITH CONTRAST TECHNIQUE: Multidetector CT imaging of the chest was performed using the standard protocol during bolus administration of intravenous contrast. Multiplanar CT image reconstructions and MIPs were obtained to evaluate the vascular anatomy. Multidetector CT imaging of the abdomen and pelvis was performed using the standard protocol during bolus administration of intravenous contrast. CONTRAST:  ISOVUE-370 IOPAMIDOL (ISOVUE-370) INJECTION 76% COMPARISON:  Abdomen and pelvis CT 04/26/2015 FINDINGS: CTA CHEST FINDINGS Cardiovascular: Satisfactory opacification of the pulmonary arteries to the segmental level. No evidence of pulmonary embolism. Normal heart size. No pericardial effusion. Atherosclerotic calcification of the aorta and coronaries. Mediastinum/Nodes: Negative for adenopathy or mass Lungs/Pleura: There is no edema, consolidation, effusion, or pneumothorax. Musculoskeletal: No acute osseous finding. There is vacuum phenomenon at the left T11-12 foramen as seen with disc herniation or ganglion. Question radicular pain as cause of symptoms. 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 Review of the MIP images confirms the above findings. CT ABDOMEN and PELVIS FINDINGS Hepatobiliary: No focal liver abnormality.No evidence of biliary obstruction or stone. Pancreas: Unremarkable. Spleen: Unremarkable. Adrenals/Urinary Tract: Negative adrenals. No hydronephrosis or ureteral stone. 1 mm left lower pole renal calculus. Unremarkable bladder. Stomach/Bowel: No obstruction. Appendectomy changes. Proximal gastric diverticulum. Colonic diverticulosis, generalized but greatest at the sigmoid. Vascular/Lymphatic: No acute vascular abnormality. Atherosclerotic calcification. No mass or  adenopathy. Reproductive:No pathologic findings. Other: No ascites or pneumoperitoneum. Musculoskeletal: No acute abnormalities. Advanced and diffuse disc and facet degeneration with levoscoliosis. Review of the MIP images confirms the above findings. IMPRESSION: 1. Negative for pulmonary embolism. No acute finding in the chest or abdomen. 2. 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 Is the patient's left-sided pain radicular? 3.  Aortic Atherosclerosis (ICD10-I70.0).  Coronary atherosclerosis. 4. Colonic diverticulosis. 5. Small left renal calculus. Electronically Signed   By: Marnee Spring M.D.   On: 07/27/2017 12:43   Ct Abdomen Pelvis W Contrast  Result Date: 07/27/2017 CLINICAL DATA:  New onset pleuritic left-sided chest pain radiating to the back. EXAM: CT ANGIOGRAPHY CHEST CT ABDOMEN AND PELVIS WITH CONTRAST TECHNIQUE: Multidetector CT imaging of the chest was performed using the standard protocol during bolus administration  of intravenous contrast. Multiplanar CT image reconstructions and MIPs were obtained to evaluate the vascular anatomy. Multidetector CT imaging of the abdomen and pelvis was performed using the standard protocol during bolus administration of intravenous contrast. CONTRAST:  ISOVUE-370 IOPAMIDOL (ISOVUE-370) INJECTION 76% COMPARISON:  Abdomen and pelvis CT 04/26/2015 FINDINGS: CTA CHEST FINDINGS Cardiovascular: Satisfactory opacification of the pulmonary arteries to the segmental level. No evidence of pulmonary embolism. Normal heart size. No pericardial effusion. Atherosclerotic calcification of the aorta and coronaries. Mediastinum/Nodes: Negative for adenopathy or mass Lungs/Pleura: There is no edema, consolidation, effusion, or pneumothorax. Musculoskeletal: No acute osseous finding. There is vacuum phenomenon at the left T11-12 foramen as seen with disc herniation or ganglion. Question radicular pain as cause of symptoms. There was left  T11-12 severe left foraminal stenosis by MRI in 2018. Review of the MIP images confirms the above findings. CT ABDOMEN and PELVIS FINDINGS Hepatobiliary: No focal liver abnormality.No evidence of biliary obstruction or stone. Pancreas: Unremarkable. Spleen: Unremarkable. Adrenals/Urinary Tract: Negative adrenals. No hydronephrosis or ureteral stone. 1 mm left lower pole renal calculus. Unremarkable bladder. Stomach/Bowel: No obstruction. Appendectomy changes. Proximal gastric diverticulum. Colonic diverticulosis, generalized but greatest at the sigmoid. Vascular/Lymphatic: No acute vascular abnormality. Atherosclerotic calcification. No mass or adenopathy. Reproductive:No pathologic findings. Other: No ascites or pneumoperitoneum. Musculoskeletal: No acute abnormalities. Advanced and diffuse disc and facet degeneration with levoscoliosis. Review of the MIP images confirms the above findings. IMPRESSION: 1. Negative for pulmonary embolism. No acute finding in the chest or abdomen. 2. 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 Is the patient's left-sided pain radicular? 3.  Aortic Atherosclerosis (ICD10-I70.0).  Coronary atherosclerosis. 4. Colonic diverticulosis. 5. Small left renal calculus. Electronically Signed   By: Marnee Spring M.D.   On: 07/27/2017 12:43    ____________________________________________   PROCEDURES  Procedure(s) performed:  Procedures  Critical Care performed:   ____________________________________________   INITIAL IMPRESSION / ASSESSMENT AND PLAN / ED COURSE   Clinical Course as of Jul 27 1417  Tue Jul 27, 2017  1325 Calcium: 9.8 [PM]    Clinical Course User Index [PM] Arnaldo Natal, MD   We will have the patient follow-up with her regular doctor and orthopedics.  Tylenol for the pain for the meantime.  ____________________________________________   FINAL CLINICAL IMPRESSION(S) / ED DIAGNOSES  Final diagnoses:  Intercostal  pain     ED Discharge Orders    None       Note:  This document was prepared using Dragon voice recognition software and may include unintentional dictation errors.    Arnaldo Natal, MD 07/27/17 [phone]

## 2017-07-27 NOTE — ED Notes (Signed)
CT technician at bedside for questionnaire and oral contrast.

## 2017-07-28 DIAGNOSIS — M5416 Radiculopathy, lumbar region: Secondary | ICD-10-CM | POA: Diagnosis not present

## 2017-07-28 DIAGNOSIS — M62830 Muscle spasm of back: Secondary | ICD-10-CM | POA: Diagnosis not present

## 2017-07-28 DIAGNOSIS — M5136 Other intervertebral disc degeneration, lumbar region: Secondary | ICD-10-CM | POA: Diagnosis not present

## 2017-07-28 DIAGNOSIS — M7062 Trochanteric bursitis, left hip: Secondary | ICD-10-CM | POA: Diagnosis not present

## 2017-07-29 DIAGNOSIS — R42 Dizziness and giddiness: Secondary | ICD-10-CM | POA: Diagnosis not present

## 2017-07-29 DIAGNOSIS — I10 Essential (primary) hypertension: Secondary | ICD-10-CM | POA: Diagnosis not present

## 2017-07-29 DIAGNOSIS — M545 Low back pain: Secondary | ICD-10-CM | POA: Diagnosis not present

## 2017-07-29 DIAGNOSIS — G309 Alzheimer's disease, unspecified: Secondary | ICD-10-CM | POA: Diagnosis not present

## 2017-07-29 DIAGNOSIS — F0280 Dementia in other diseases classified elsewhere without behavioral disturbance: Secondary | ICD-10-CM | POA: Diagnosis not present

## 2017-07-29 DIAGNOSIS — F0150 Vascular dementia without behavioral disturbance: Secondary | ICD-10-CM | POA: Diagnosis not present

## 2017-08-03 DIAGNOSIS — F0280 Dementia in other diseases classified elsewhere without behavioral disturbance: Secondary | ICD-10-CM | POA: Diagnosis not present

## 2017-08-03 DIAGNOSIS — R42 Dizziness and giddiness: Secondary | ICD-10-CM | POA: Diagnosis not present

## 2017-08-03 DIAGNOSIS — I10 Essential (primary) hypertension: Secondary | ICD-10-CM | POA: Diagnosis not present

## 2017-08-03 DIAGNOSIS — F0150 Vascular dementia without behavioral disturbance: Secondary | ICD-10-CM | POA: Diagnosis not present

## 2017-08-03 DIAGNOSIS — G309 Alzheimer's disease, unspecified: Secondary | ICD-10-CM | POA: Diagnosis not present

## 2017-08-03 DIAGNOSIS — M545 Low back pain: Secondary | ICD-10-CM | POA: Diagnosis not present

## 2017-08-06 DIAGNOSIS — F0150 Vascular dementia without behavioral disturbance: Secondary | ICD-10-CM | POA: Diagnosis not present

## 2017-08-06 DIAGNOSIS — I10 Essential (primary) hypertension: Secondary | ICD-10-CM | POA: Diagnosis not present

## 2017-08-06 DIAGNOSIS — R42 Dizziness and giddiness: Secondary | ICD-10-CM | POA: Diagnosis not present

## 2017-08-06 DIAGNOSIS — F0280 Dementia in other diseases classified elsewhere without behavioral disturbance: Secondary | ICD-10-CM | POA: Diagnosis not present

## 2017-08-06 DIAGNOSIS — M545 Low back pain: Secondary | ICD-10-CM | POA: Diagnosis not present

## 2017-08-06 DIAGNOSIS — G309 Alzheimer's disease, unspecified: Secondary | ICD-10-CM | POA: Diagnosis not present

## 2017-08-09 DIAGNOSIS — M545 Low back pain: Secondary | ICD-10-CM | POA: Diagnosis not present

## 2017-08-09 DIAGNOSIS — F0150 Vascular dementia without behavioral disturbance: Secondary | ICD-10-CM | POA: Diagnosis not present

## 2017-08-09 DIAGNOSIS — I10 Essential (primary) hypertension: Secondary | ICD-10-CM | POA: Diagnosis not present

## 2017-08-09 DIAGNOSIS — R42 Dizziness and giddiness: Secondary | ICD-10-CM | POA: Diagnosis not present

## 2017-08-09 DIAGNOSIS — F0280 Dementia in other diseases classified elsewhere without behavioral disturbance: Secondary | ICD-10-CM | POA: Diagnosis not present

## 2017-08-09 DIAGNOSIS — G309 Alzheimer's disease, unspecified: Secondary | ICD-10-CM | POA: Diagnosis not present

## 2017-08-11 DIAGNOSIS — G309 Alzheimer's disease, unspecified: Secondary | ICD-10-CM | POA: Diagnosis not present

## 2017-08-11 DIAGNOSIS — M545 Low back pain: Secondary | ICD-10-CM | POA: Diagnosis not present

## 2017-08-11 DIAGNOSIS — F0150 Vascular dementia without behavioral disturbance: Secondary | ICD-10-CM | POA: Diagnosis not present

## 2017-08-11 DIAGNOSIS — I10 Essential (primary) hypertension: Secondary | ICD-10-CM | POA: Diagnosis not present

## 2017-08-11 DIAGNOSIS — F0280 Dementia in other diseases classified elsewhere without behavioral disturbance: Secondary | ICD-10-CM | POA: Diagnosis not present

## 2017-08-11 DIAGNOSIS — R42 Dizziness and giddiness: Secondary | ICD-10-CM | POA: Diagnosis not present

## 2017-08-12 DIAGNOSIS — G8929 Other chronic pain: Secondary | ICD-10-CM | POA: Diagnosis not present

## 2017-08-12 DIAGNOSIS — L400 Psoriasis vulgaris: Secondary | ICD-10-CM

## 2017-08-12 DIAGNOSIS — E782 Mixed hyperlipidemia: Secondary | ICD-10-CM

## 2017-08-12 DIAGNOSIS — K5730 Diverticulosis of large intestine without perforation or abscess without bleeding: Secondary | ICD-10-CM

## 2017-08-12 DIAGNOSIS — M545 Low back pain: Secondary | ICD-10-CM

## 2017-08-12 DIAGNOSIS — G309 Alzheimer's disease, unspecified: Secondary | ICD-10-CM | POA: Diagnosis not present

## 2017-08-12 DIAGNOSIS — Z7982 Long term (current) use of aspirin: Secondary | ICD-10-CM

## 2017-08-12 DIAGNOSIS — F0280 Dementia in other diseases classified elsewhere without behavioral disturbance: Secondary | ICD-10-CM | POA: Diagnosis not present

## 2017-08-12 DIAGNOSIS — Z9181 History of falling: Secondary | ICD-10-CM

## 2017-08-12 DIAGNOSIS — F0150 Vascular dementia without behavioral disturbance: Secondary | ICD-10-CM | POA: Diagnosis not present

## 2017-08-12 DIAGNOSIS — M1991 Primary osteoarthritis, unspecified site: Secondary | ICD-10-CM

## 2017-08-12 DIAGNOSIS — Z8673 Personal history of transient ischemic attack (TIA), and cerebral infarction without residual deficits: Secondary | ICD-10-CM

## 2017-08-12 DIAGNOSIS — K219 Gastro-esophageal reflux disease without esophagitis: Secondary | ICD-10-CM

## 2017-08-12 DIAGNOSIS — I10 Essential (primary) hypertension: Secondary | ICD-10-CM | POA: Diagnosis not present

## 2017-10-06 ENCOUNTER — Ambulatory Visit: Payer: Medicare HMO | Admitting: Internal Medicine

## 2017-11-16 ENCOUNTER — Encounter: Payer: Self-pay | Admitting: Internal Medicine

## 2017-11-17 NOTE — Telephone Encounter (Signed)
Immunizations that are needed are flu (for this year), pneumovax and it also appears she needs tetanus if has not had one in 10 years.  Would not recommend getting all in same day.

## 2017-11-17 NOTE — Telephone Encounter (Signed)
Pt had prevnar in 2017. Had both shingrix. Need flu shot. OK to recommend pneumovax as well?

## 2017-11-22 ENCOUNTER — Ambulatory Visit (INDEPENDENT_AMBULATORY_CARE_PROVIDER_SITE_OTHER): Payer: Medicare HMO | Admitting: *Deleted

## 2017-11-22 DIAGNOSIS — Z23 Encounter for immunization: Secondary | ICD-10-CM

## 2017-11-22 NOTE — Telephone Encounter (Signed)
Pt coming in for flu and pneumonia shot today

## 2017-12-28 ENCOUNTER — Other Ambulatory Visit: Payer: Self-pay | Admitting: Internal Medicine

## 2017-12-31 ENCOUNTER — Other Ambulatory Visit: Payer: Self-pay | Admitting: Internal Medicine

## 2018-01-06 ENCOUNTER — Ambulatory Visit: Payer: Medicare HMO | Admitting: Internal Medicine

## 2018-01-12 ENCOUNTER — Ambulatory Visit (INDEPENDENT_AMBULATORY_CARE_PROVIDER_SITE_OTHER): Payer: Medicare HMO | Admitting: Internal Medicine

## 2018-01-12 ENCOUNTER — Ambulatory Visit: Payer: Medicare HMO | Admitting: Internal Medicine

## 2018-01-12 ENCOUNTER — Encounter: Payer: Self-pay | Admitting: Internal Medicine

## 2018-01-12 VITALS — BP 140/62 | HR 64 | Temp 97.9°F | Resp 16 | Wt 148.6 lb

## 2018-01-12 DIAGNOSIS — K5730 Diverticulosis of large intestine without perforation or abscess without bleeding: Secondary | ICD-10-CM

## 2018-01-12 DIAGNOSIS — G309 Alzheimer's disease, unspecified: Secondary | ICD-10-CM | POA: Diagnosis not present

## 2018-01-12 DIAGNOSIS — I10 Essential (primary) hypertension: Secondary | ICD-10-CM | POA: Diagnosis not present

## 2018-01-12 DIAGNOSIS — F0280 Dementia in other diseases classified elsewhere without behavioral disturbance: Secondary | ICD-10-CM | POA: Diagnosis not present

## 2018-01-12 DIAGNOSIS — E7800 Pure hypercholesterolemia, unspecified: Secondary | ICD-10-CM | POA: Diagnosis not present

## 2018-01-12 DIAGNOSIS — F0150 Vascular dementia without behavioral disturbance: Secondary | ICD-10-CM

## 2018-01-12 DIAGNOSIS — K219 Gastro-esophageal reflux disease without esophagitis: Secondary | ICD-10-CM | POA: Diagnosis not present

## 2018-01-12 DIAGNOSIS — R739 Hyperglycemia, unspecified: Secondary | ICD-10-CM

## 2018-01-12 LAB — BASIC METABOLIC PANEL
BUN: 17 mg/dL (ref 6–23)
CHLORIDE: 102 meq/L (ref 96–112)
CO2: 30 meq/L (ref 19–32)
Calcium: 9.3 mg/dL (ref 8.4–10.5)
Creatinine, Ser: 0.77 mg/dL (ref 0.40–1.20)
GFR: 77.22 mL/min (ref >60.00)
Glucose, Bld: 88 mg/dL (ref 70–99)
POTASSIUM: 4.2 meq/L (ref 3.5–5.1)
SODIUM: 139 meq/L (ref 135–145)

## 2018-01-12 LAB — HEMOGLOBIN A1C: HEMOGLOBIN A1C: 5.6 % (ref 4.6–6.5)

## 2018-01-12 LAB — HEPATIC FUNCTION PANEL
ALBUMIN: 4.1 g/dL (ref 3.5–5.2)
ALT: 29 U/L (ref 0–35)
AST: 28 U/L (ref 0–37)
Alkaline Phosphatase: 71 U/L (ref 39–117)
Bilirubin, Direct: 0.1 mg/dL (ref 0.0–0.3)
Total Bilirubin: 0.5 mg/dL (ref 0.2–1.2)
Total Protein: 6.7 g/dL (ref 6.0–8.3)

## 2018-01-12 NOTE — Progress Notes (Signed)
Patient ID: Anita Santana, female   DOB: Dec 18, 1940, 77 y.o.   MRN: [id number]   Subjective:    Patient ID: Anita Santana, female    DOB: 02-Aug-1940, 78 y.o.   MRN: [id number]  HPI  Patient here for a scheduled follow up.  She is accompanied by her husband.  History obtained mostly from her husband.  Still having memory issues.  On aricept and namenda.  Seeing neurology.  Denies any pain.  Eating.  No chest pain.  No sob.  No acid reflux.  No abdominal pain.  Bowels moving.  Husband feels he is handling things well at home.  Does not feel needs any further intervention.     Past Medical History:  Diagnosis Date  . Angioedema   . Arthritis   . Dementia ([place])   . Diverticulosis of colon 02/08/12   Colonoscopy  . GERD (gastroesophageal reflux disease)   . Hypercholesteremia   . Hypertension   . Psoriasis   . Syncope and collapse   . TIA (transient ischemic attack)    Past Surgical History:  Procedure Laterality Date  . cataract surgery  2012  . COLONOSCOPY WITH PROPOFOL N/A 07/01/2015   Procedure: COLONOSCOPY WITH PROPOFOL;  Surgeon: Lollie Sails, MD;  Location: [facility] ENDOSCOPY;  Service: Endoscopy;  Laterality: N/A;  . COLONOSCOPY WITH PROPOFOL N/A 07/04/2015   Procedure: COLONOSCOPY WITH PROPOFOL;  Surgeon: Lollie Sails, MD;  Location: [facility] ENDOSCOPY;  Service: Endoscopy;  Laterality: N/A;  . EYE SURGERY    . TONSILECTOMY/ADENOIDECTOMY WITH MYRINGOTOMY  1950  . TONSILLECTOMY     Family History  Problem Relation Age of Onset  . Hypercholesterolemia Mother   . Stroke Mother   . Hypertension Mother   . Cancer Mother        ovarian/uterus  . Hypercholesterolemia Father   . Heart disease Father   . Heart attack Father   . Cancer Maternal Grandmother        unsure of what type   Social History   Socioeconomic History  . Marital status: Married    Spouse name: Not on file  . Number of children: Not on file  . Years of education: Not on file  . Highest education  level: Not on file  Occupational History  . Not on file  Social Needs  . Financial resource strain: Not on file  . Food insecurity:    Worry: Not on file    Inability: Not on file  . Transportation needs:    Medical: Not on file    Non-medical: Not on file  Tobacco Use  . Smoking status: Never Smoker  . Smokeless tobacco: Never Used  Substance and Sexual Activity  . Alcohol use: No    Alcohol/week: 0.0 standard drinks  . Drug use: No  . Sexual activity: Not Currently  Lifestyle  . Physical activity:    Days per week: Not on file    Minutes per session: Not on file  . Stress: Not on file  Relationships  . Social connections:    Talks on phone: Not on file    Gets together: Not on file    Attends religious service: Not on file    Active member of club or organization: Not on file    Attends meetings of clubs or organizations: Not on file    Relationship status: Not on file  Other Topics Concern  . Not on file  Social History Narrative  . Not on file  Outpatient Encounter Medications as of 01/12/2018  Medication Sig  . acetaminophen (TYLENOL ARTHRITIS PAIN) 650 MG CR tablet Take 650 mg by mouth as needed. Reported on 01/24/2015  . aspirin EC 81 MG tablet Take 81 mg by mouth.  Marland Kitchen atorvastatin (LIPITOR) 20 MG tablet TAKE 1 TABLET (20 MG TOTAL) BY MOUTH DAILY.  . baclofen (LIORESAL) 10 MG tablet Take 5 mg 2 (two) times daily by mouth.  . docusate sodium (COLACE CLEAR) 50 MG capsule Take by mouth.  . galantamine (RAZADYNE) 12 MG tablet Take 12 mg by mouth 2 (two) times daily.  Marland Kitchen ibuprofen (ADVIL,MOTRIN) 200 MG tablet Take 400 mg by mouth 2 (two) times daily as needed.  Marland Kitchen lisinopril (PRINIVIL,ZESTRIL) 40 MG tablet TAKE 1 TABLET (40 MG TOTAL) BY MOUTH DAILY.  . Magnesium 250 MG TABS Take 500 mg by mouth daily.  . memantine (NAMENDA) 5 MG tablet Take 1 tablet 2 (two) times daily by mouth.  . multivitamin-iron-minerals-folic acid (CENTRUM) chewable tablet Chew by mouth.  .  pantoprazole (PROTONIX) 40 MG tablet TAKE 1 TABLET EVERY DAY  . traMADol (ULTRAM) 50 MG tablet Take 1 tablet by mouth 2 (two) times daily as needed.   No facility-administered encounter medications on file as of 01/12/2018.     Review of Systems  Constitutional: Negative for appetite change and unexpected weight change.  HENT: Negative for congestion and sinus pressure.   Respiratory: Negative for cough, chest tightness and shortness of breath.   Cardiovascular: Negative for chest pain, palpitations and leg swelling.  Gastrointestinal: Negative for abdominal pain, diarrhea, nausea and vomiting.  Genitourinary: Negative for difficulty urinating and dysuria.  Musculoskeletal: Negative for joint swelling and myalgias.  Skin: Negative for color change and rash.  Neurological: Negative for dizziness, light-headedness and headaches.  Psychiatric/Behavioral: Negative for agitation and dysphoric mood.       Objective:    Physical Exam Constitutional:      General: She is not in acute distress.    Appearance: Normal appearance.  HENT:     Nose: Nose normal. No congestion.     Mouth/Throat:     Pharynx: No oropharyngeal exudate or posterior oropharyngeal erythema.  Neck:     Musculoskeletal: Neck supple. No muscular tenderness.     Thyroid: No thyromegaly.  Cardiovascular:     Rate and Rhythm: Normal rate and regular rhythm.  Pulmonary:     Effort: No respiratory distress.     Breath sounds: Normal breath sounds. No wheezing.  Abdominal:     General: Bowel sounds are normal.     Palpations: Abdomen is soft.     Tenderness: There is no abdominal tenderness.  Musculoskeletal:        General: No swelling or tenderness.  Lymphadenopathy:     Cervical: No cervical adenopathy.  Skin:    Findings: No erythema or rash.  Neurological:     Mental Status: She is alert.  Psychiatric:        Mood and Affect: Mood normal.        Behavior: Behavior normal.     BP 140/62 (BP Location:  Left Arm, Patient Position: Sitting, Cuff Size: Normal)   Pulse 64   Temp 97.9 F (36.6 C) (Oral)   Resp 16   Wt 148 lb 9.6 oz (67.4 kg)   SpO2 98%   BMI 23.27 kg/m  Wt Readings from Last 3 Encounters:  01/12/18 148 lb 9.6 oz (67.4 kg)  07/27/17 144 lb 12.8 oz (65.7 kg)  07/06/17  145 lb 9.6 oz (66 kg)     Lab Results  Component Value Date   WBC 10.0 07/27/2017   HGB 14.1 07/27/2017   HCT 40.9 07/27/2017   PLT 231 07/27/2017   GLUCOSE 88 01/12/2018   CHOL 218 (H) 07/06/2017   TRIG 137.0 07/06/2017   HDL 76.00 07/06/2017   LDLDIRECT 237.0 07/15/2012   LDLCALC 114 (H) 07/06/2017   ALT 29 01/12/2018   AST 28 01/12/2018   NA 139 01/12/2018   K 4.2 01/12/2018   CL 102 01/12/2018   CREATININE 0.77 01/12/2018   BUN 17 01/12/2018   CO2 30 01/12/2018   TSH 1.81 07/06/2017   HGBA1C 5.6 01/12/2018    Dg Chest 2 View  Result Date: 07/27/2017 CLINICAL DATA:  Left-sided chest pain. EXAM: CHEST - 2 VIEW COMPARISON:  11/18/2016. FINDINGS: Mediastinum hilar structures normal. Lungs are clear. No pleural effusion or pneumothorax. Heart size normal. Thoracic spine scoliosis and degenerative change. IMPRESSION: No acute cardiopulmonary disease. Electronically Signed   By: Marcello Moores  Register   On: 07/27/2017 09:54   Ct Angio Chest Pe W And/or Wo Contrast  Result Date: 07/27/2017 CLINICAL DATA:  New onset pleuritic left-sided chest pain radiating to the back. EXAM: CT ANGIOGRAPHY CHEST CT ABDOMEN AND PELVIS WITH CONTRAST TECHNIQUE: Multidetector CT imaging of the chest was performed using the standard protocol during bolus administration of intravenous contrast. Multiplanar CT image reconstructions and MIPs were obtained to evaluate the vascular anatomy. Multidetector CT imaging of the abdomen and pelvis was performed using the standard protocol during bolus administration of intravenous contrast. CONTRAST:  138m ISOVUE-370 IOPAMIDOL (ISOVUE-370) INJECTION 76% COMPARISON:  Abdomen and pelvis CT  04/26/2015 FINDINGS: CTA CHEST FINDINGS Cardiovascular: Satisfactory opacification of the pulmonary arteries to the segmental level. No evidence of pulmonary embolism. Normal heart size. No pericardial effusion. Atherosclerotic calcification of the aorta and coronaries. Mediastinum/Nodes: Negative for adenopathy or mass Lungs/Pleura: There is no edema, consolidation, effusion, or pneumothorax. Musculoskeletal: No acute osseous finding. There is vacuum phenomenon at the left T11-12 foramen as seen with disc herniation or ganglion. Question radicular pain as cause of symptoms. 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 Review of the MIP images confirms the above findings. CT ABDOMEN and PELVIS FINDINGS Hepatobiliary: No focal liver abnormality.No evidence of biliary obstruction or stone. Pancreas: Unremarkable. Spleen: Unremarkable. Adrenals/Urinary Tract: Negative adrenals. No hydronephrosis or ureteral stone. 1 mm left lower pole renal calculus. Unremarkable bladder. Stomach/Bowel: No obstruction. Appendectomy changes. Proximal gastric diverticulum. Colonic diverticulosis, generalized but greatest at the sigmoid. Vascular/Lymphatic: No acute vascular abnormality. Atherosclerotic calcification. No mass or adenopathy. Reproductive:No pathologic findings. Other: No ascites or pneumoperitoneum. Musculoskeletal: No acute abnormalities. Advanced and diffuse disc and facet degeneration with levoscoliosis. Review of the MIP images confirms the above findings. IMPRESSION: 1. Negative for pulmonary embolism. No acute finding in the chest or abdomen. 2. 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 Is the patient's left-sided pain radicular? 3.  Aortic Atherosclerosis (ICD10-I70.0).  Coronary atherosclerosis. 4. Colonic diverticulosis. 5. Small left renal calculus. Electronically Signed   By: JMonte FantasiaM.D.   On: 07/27/2017 12:43   Ct Abdomen Pelvis W  Contrast  Result Date: 07/27/2017 CLINICAL DATA:  New onset pleuritic left-sided chest pain radiating to the back. EXAM: CT ANGIOGRAPHY CHEST CT ABDOMEN AND PELVIS WITH CONTRAST TECHNIQUE: Multidetector CT imaging of the chest was performed using the standard protocol during bolus administration of intravenous contrast. Multiplanar CT image reconstructions  and MIPs were obtained to evaluate the vascular anatomy. Multidetector CT imaging of the abdomen and pelvis was performed using the standard protocol during bolus administration of intravenous contrast. CONTRAST:  162m ISOVUE-370 IOPAMIDOL (ISOVUE-370) INJECTION 76% COMPARISON:  Abdomen and pelvis CT 04/26/2015 FINDINGS: CTA CHEST FINDINGS Cardiovascular: Satisfactory opacification of the pulmonary arteries to the segmental level. No evidence of pulmonary embolism. Normal heart size. No pericardial effusion. Atherosclerotic calcification of the aorta and coronaries. Mediastinum/Nodes: Negative for adenopathy or mass Lungs/Pleura: There is no edema, consolidation, effusion, or pneumothorax. Musculoskeletal: No acute osseous finding. There is vacuum phenomenon at the left T11-12 foramen as seen with disc herniation or ganglion. Question radicular pain as cause of symptoms. 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 Review of the MIP images confirms the above findings. CT ABDOMEN and PELVIS FINDINGS Hepatobiliary: No focal liver abnormality.No evidence of biliary obstruction or stone. Pancreas: Unremarkable. Spleen: Unremarkable. Adrenals/Urinary Tract: Negative adrenals. No hydronephrosis or ureteral stone. 1 mm left lower pole renal calculus. Unremarkable bladder. Stomach/Bowel: No obstruction. Appendectomy changes. Proximal gastric diverticulum. Colonic diverticulosis, generalized but greatest at the sigmoid. Vascular/Lymphatic: No acute vascular abnormality. Atherosclerotic calcification. No mass or adenopathy. Reproductive:No  pathologic findings. Other: No ascites or pneumoperitoneum. Musculoskeletal: No acute abnormalities. Advanced and diffuse disc and facet degeneration with levoscoliosis. Review of the MIP images confirms the above findings. IMPRESSION: 1. Negative for pulmonary embolism. No acute finding in the chest or abdomen. 2. 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 Is the patient's left-sided pain radicular? 3.  Aortic Atherosclerosis (ICD10-I70.0).  Coronary atherosclerosis. 4. Colonic diverticulosis. 5. Small left renal calculus. Electronically Signed   By: JMonte FantasiaM.D.   On: 07/27/2017 12:43       Assessment & Plan:   Problem List Items Addressed This Visit    Diverticulosis of colon without hemorrhage    Bowels doing well.  Follow.        GERD (gastroesophageal reflux disease)    Controlled on protonix.        Hypercholesteremia - Primary    On lipitor.  Low cholesterol diet and exercise.  Follow lipid panel and liver function tests.        Relevant Orders   Hepatic function panel (Completed)   Hyperglycemia    Low carb diet and exercise.  Follow met b and a1c.        Relevant Orders   Hemoglobin A1c (Completed)   Hypertension    Blood pressure under good control.  Continue same medication regimen.  Follow pressures.  Follow metabolic panel.        Relevant Orders   Basic metabolic panel (Completed)   Mixed Alzheimer's and vascular dementia ([facility]    Has been evaluated by Dr SManuella Ghazi  On namenda and aricept.            CEinar Pheasant MD

## 2018-01-16 ENCOUNTER — Encounter: Payer: Self-pay | Admitting: Internal Medicine

## 2018-01-16 NOTE — Assessment & Plan Note (Signed)
Controlled on protonix.   

## 2018-01-16 NOTE — Assessment & Plan Note (Signed)
Bowels doing well.  Follow.   

## 2018-01-16 NOTE — Assessment & Plan Note (Signed)
Low carb diet and exercise.  Follow met b and a1c.   

## 2018-01-16 NOTE — Assessment & Plan Note (Signed)
Has been evaluated by Dr Sherryll Burger.  On namenda and aricept.

## 2018-01-16 NOTE — Assessment & Plan Note (Signed)
On lipitor.  Low cholesterol diet and exercise.  Follow lipid panel and liver function tests.   

## 2018-01-16 NOTE — Assessment & Plan Note (Signed)
Blood pressure under good control.  Continue same medication regimen.  Follow pressures.  Follow metabolic panel.   

## 2018-06-24 DIAGNOSIS — G309 Alzheimer's disease, unspecified: Secondary | ICD-10-CM | POA: Diagnosis not present

## 2018-06-24 DIAGNOSIS — F0280 Dementia in other diseases classified elsewhere without behavioral disturbance: Secondary | ICD-10-CM | POA: Diagnosis not present

## 2018-06-24 DIAGNOSIS — F0150 Vascular dementia without behavioral disturbance: Secondary | ICD-10-CM | POA: Diagnosis not present

## 2018-06-26 DIAGNOSIS — F0280 Dementia in other diseases classified elsewhere without behavioral disturbance: Secondary | ICD-10-CM | POA: Diagnosis not present

## 2018-06-26 DIAGNOSIS — R251 Tremor, unspecified: Secondary | ICD-10-CM | POA: Diagnosis not present

## 2018-06-26 DIAGNOSIS — G309 Alzheimer's disease, unspecified: Secondary | ICD-10-CM | POA: Diagnosis not present

## 2018-06-26 DIAGNOSIS — F0150 Vascular dementia without behavioral disturbance: Secondary | ICD-10-CM | POA: Diagnosis not present

## 2018-07-22 ENCOUNTER — Telehealth: Payer: Self-pay | Admitting: Student

## 2018-07-22 NOTE — Telephone Encounter (Signed)
Called patient's daughter Ashby Dawes to schedule Palliativeconsult, no answer.  Left message with reason for call along with my name and contact information.

## 2018-07-22 NOTE — Telephone Encounter (Signed)
Rec'd call back from daughter Ashby Dawes and she stated that her Mom was getting services from [facility] but she wasn't sure what type of service.  I told her that I would call to find out and let her know.  When I called [facility] I found out that patient was getting Hospice services.  I called daughter back but had to leave a message.  Left message letting her know that patient cannot receive both Hospice and Palliative services at the same time and that I would cancel the referral for Palliative and notify Dr. Manuella Ghazi.  Left my contact number if she had any further questions.

## 2018-07-25 ENCOUNTER — Encounter: Payer: Medicare HMO | Admitting: Internal Medicine

## 2018-08-25 ENCOUNTER — Telehealth: Payer: Self-pay

## 2018-08-25 NOTE — Telephone Encounter (Signed)
Anita Santana spoke with pt's daughter and they have gotten a rx for cipro for the pt, so everything has been taken care of.

## 2018-08-25 NOTE — Telephone Encounter (Signed)
Copied from [place] [id number]. Topic: General - Other >> Aug 25, 2018  2:52 PM Percell Belt A wrote: Reason for CRM: Pt Daughter called in and stated that Hospice was suppose to call in a order for a medication for a uti.  Pt is bed ridden, she has all the symptoms.  Fever painful urination, smell and color  Best number for daughter - [phone] she is requesting a call back today  [facility]

## 2018-08-25 NOTE — Telephone Encounter (Signed)
Hospice called in Cipro 250 mg BID x  10 days. For UTI

## 2018-08-26 DIAGNOSIS — F0280 Dementia in other diseases classified elsewhere without behavioral disturbance: Secondary | ICD-10-CM | POA: Diagnosis not present

## 2018-08-26 DIAGNOSIS — F0150 Vascular dementia without behavioral disturbance: Secondary | ICD-10-CM | POA: Diagnosis not present

## 2018-08-26 DIAGNOSIS — G309 Alzheimer's disease, unspecified: Secondary | ICD-10-CM | POA: Diagnosis not present

## 2018-10-06 ENCOUNTER — Telehealth: Payer: Self-pay

## 2018-10-06 NOTE — Telephone Encounter (Signed)
Called [name].  Doing well. Has good support.  Does not need anything more at this time.

## 2018-10-06 NOTE — Telephone Encounter (Signed)
Copied from [place] [id number]. Topic: General - Deceased Patient >> 10-07-2018 11:49 AM Reyne Dumas L wrote: Reason for CRM:   Pt's spouse called and states that pt died on 2018/10/04.  Route to department's PEC Pool.

## 2018-10-06 NOTE — Telephone Encounter (Signed)
Husband called to let us know that patient passed away on 10/08/22. I have changed her status to deceased.

## 2018-10-06 DEATH — deceased

## 2018-10-25 NOTE — Telephone Encounter (Signed)
error 

## 2019-02-04 IMAGING — CR DG CHEST 2V
2 series · 2 of 2 positions shown · non-contrast
Comparison: 11/18/2016.

CLINICAL DATA: Left-sided chest pain.

EXAM:
CHEST - 2 VIEW

[chest pa]
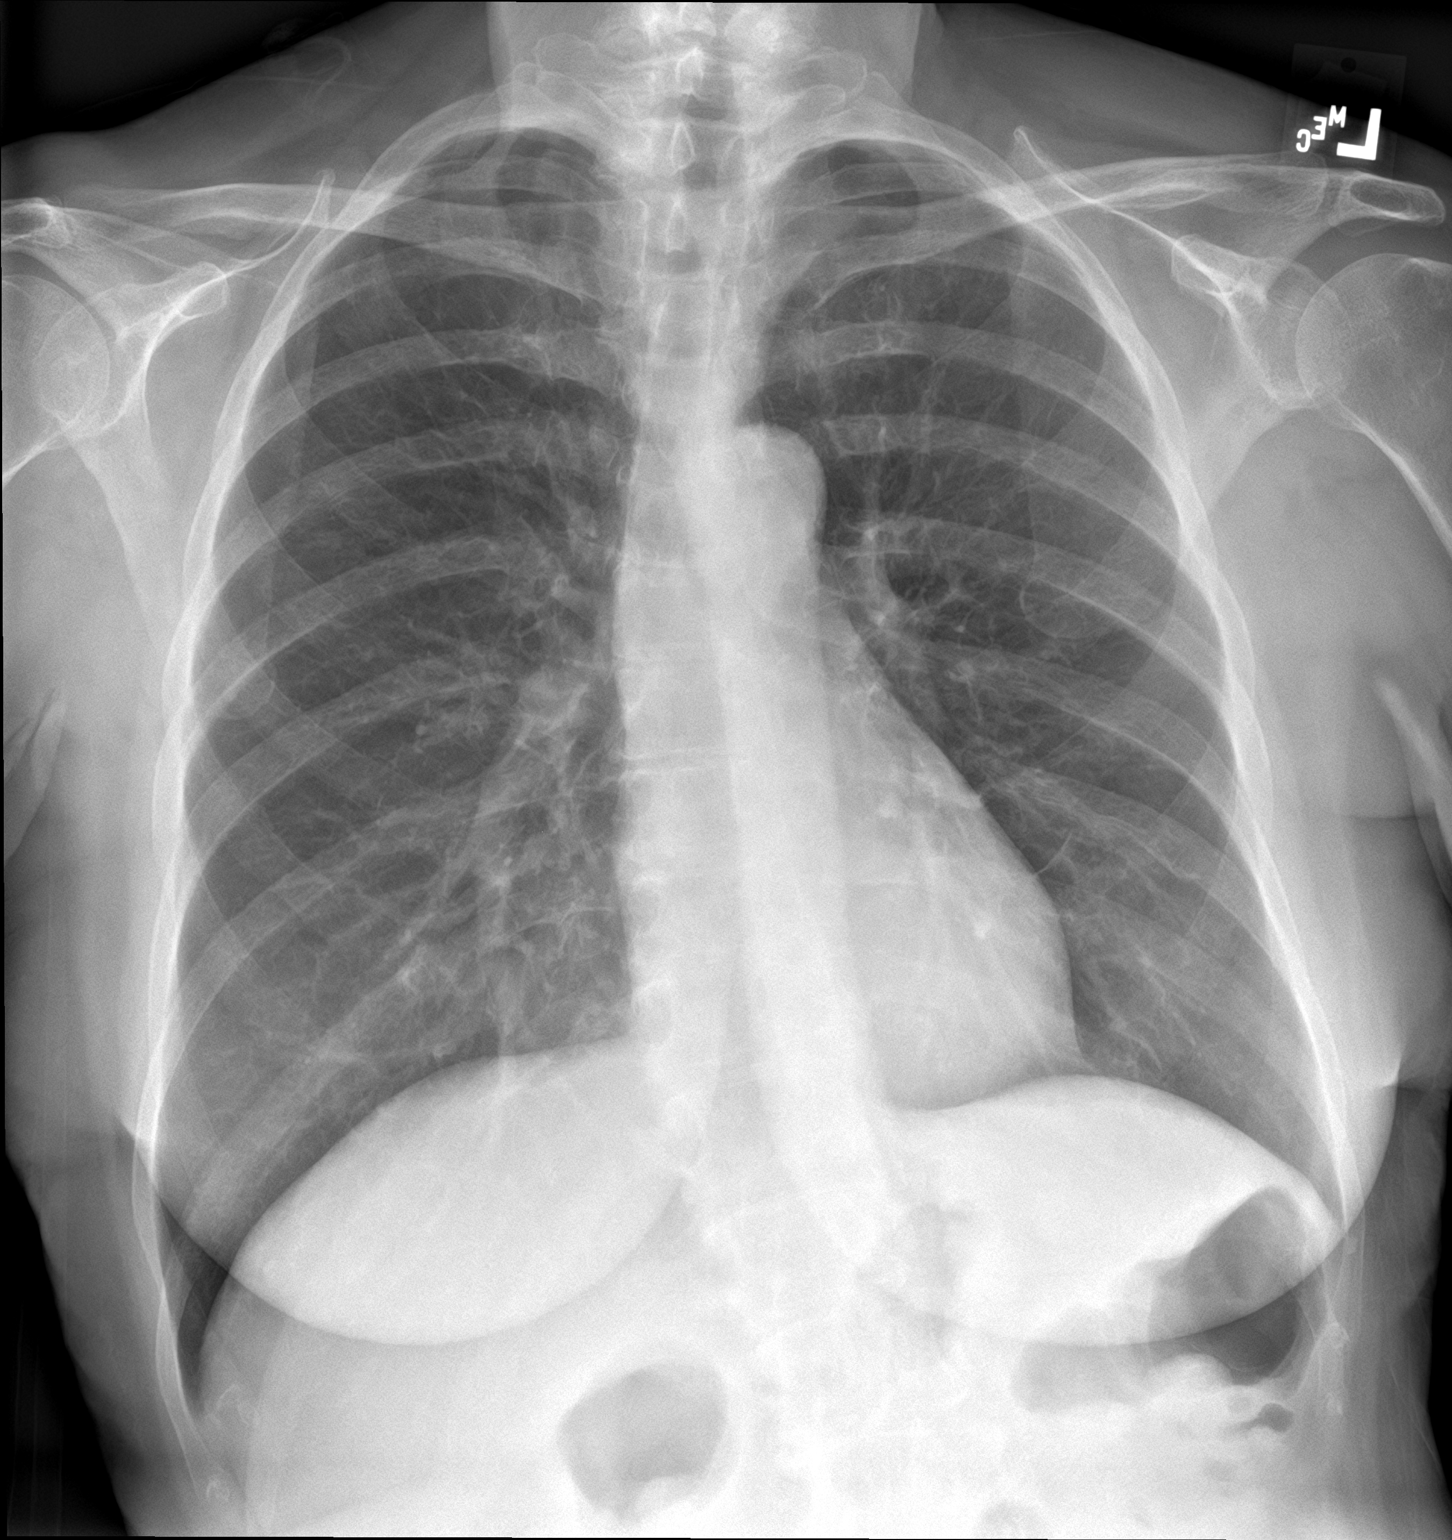

[chest lat]
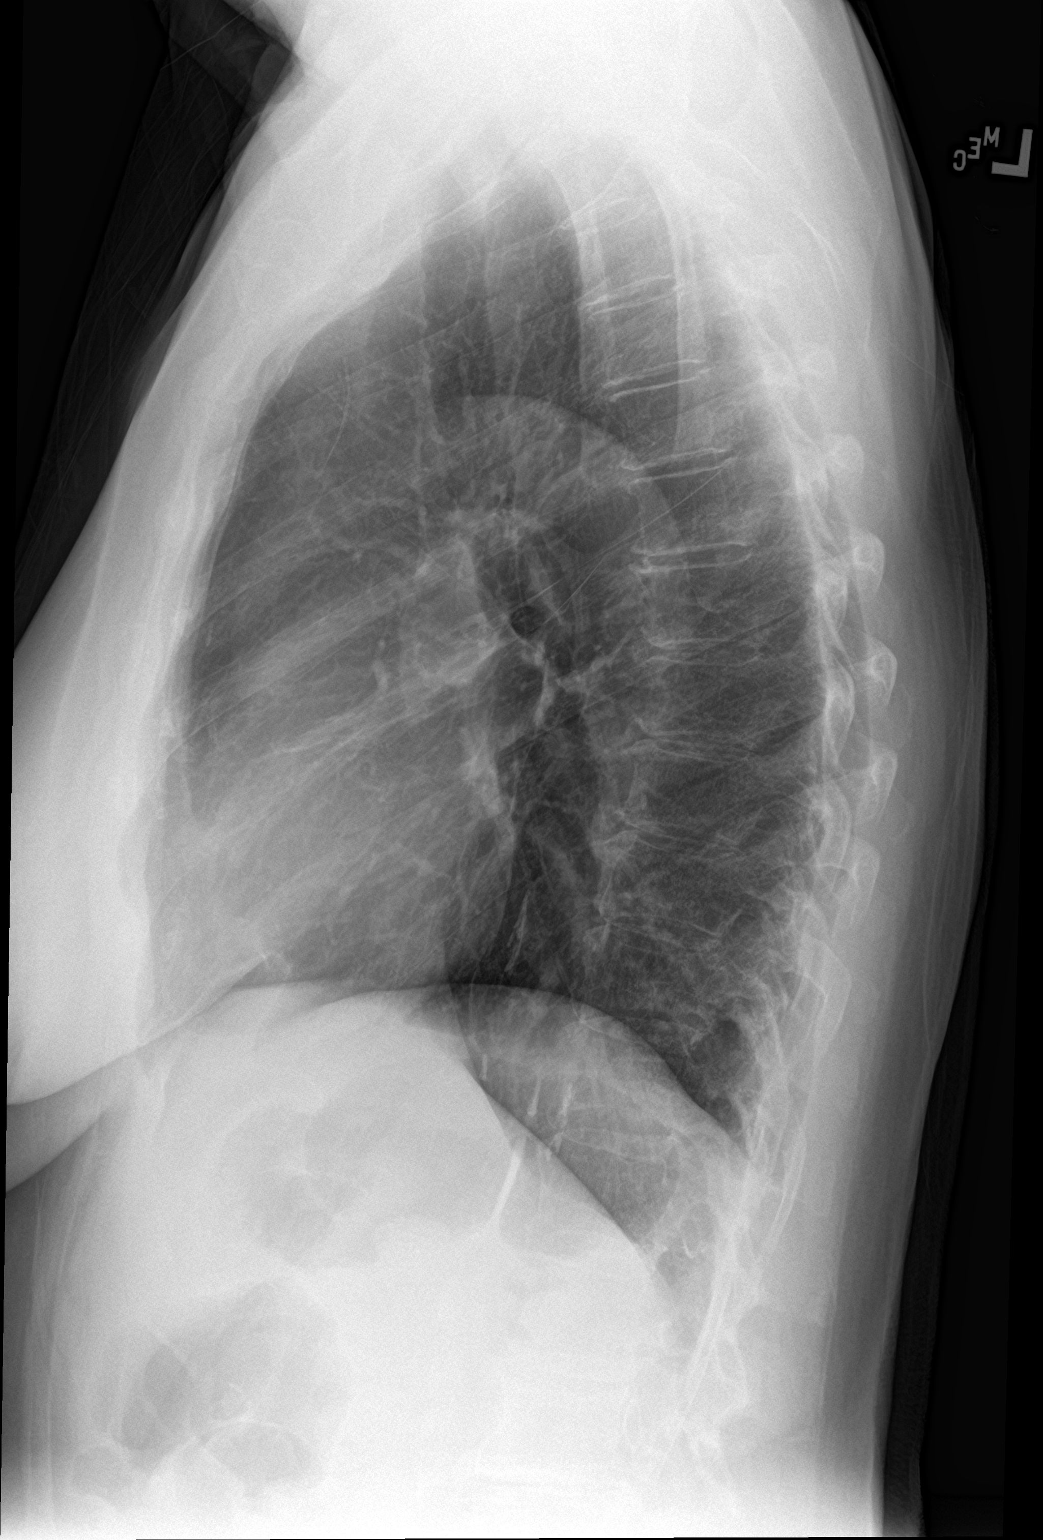

[2 of 2 positions shown; findings below may reference images not displayed]

FINDINGS: Mediastinum hilar structures normal. Lungs are clear. No pleural
effusion or pneumothorax. Heart size normal. Thoracic spine
scoliosis and degenerative change.
IMPRESSION: No acute cardiopulmonary disease.
